# Patient Record
Sex: Male | Born: 1945 | Race: White | Hispanic: No | Marital: Married | Smoking: Current every day smoker
Health system: Southern US, Community
[De-identification: ages and names within clinical notes are randomized; demographics above are authoritative.]

## PROBLEM LIST (undated history)

## (undated) DIAGNOSIS — J449 Chronic obstructive pulmonary disease, unspecified: Secondary | ICD-10-CM

## (undated) DIAGNOSIS — I714 Abdominal aortic aneurysm, without rupture, unspecified: Secondary | ICD-10-CM

## (undated) DIAGNOSIS — R972 Elevated prostate specific antigen [PSA]: Secondary | ICD-10-CM

## (undated) DIAGNOSIS — I10 Essential (primary) hypertension: Secondary | ICD-10-CM

## (undated) DIAGNOSIS — E785 Hyperlipidemia, unspecified: Secondary | ICD-10-CM

## (undated) DIAGNOSIS — N400 Enlarged prostate without lower urinary tract symptoms: Secondary | ICD-10-CM

## (undated) DIAGNOSIS — N200 Calculus of kidney: Secondary | ICD-10-CM

## (undated) HISTORY — DX: Essential (primary) hypertension: I10

## (undated) HISTORY — DX: Calculus of kidney: N20.0

## (undated) HISTORY — PX: OTHER SURGICAL HISTORY: SHX169

## (undated) HISTORY — DX: Abdominal aortic aneurysm, without rupture: I71.4

## (undated) HISTORY — DX: Abdominal aortic aneurysm, without rupture, unspecified: I71.40

## (undated) HISTORY — DX: Elevated prostate specific antigen (PSA): R97.20

## (undated) HISTORY — DX: Benign prostatic hyperplasia without lower urinary tract symptoms: N40.0

## (undated) HISTORY — DX: Chronic obstructive pulmonary disease, unspecified: J44.9

## (undated) HISTORY — DX: Hyperlipidemia, unspecified: E78.5

---

## 2005-01-10 ENCOUNTER — Emergency Department: Payer: Self-pay | Admitting: Internal Medicine

## 2005-01-13 ENCOUNTER — Emergency Department: Payer: Self-pay | Admitting: Emergency Medicine

## 2005-08-12 ENCOUNTER — Inpatient Hospital Stay: Payer: Self-pay | Admitting: Internal Medicine

## 2005-08-12 ENCOUNTER — Other Ambulatory Visit: Payer: Self-pay

## 2006-03-04 ENCOUNTER — Emergency Department: Payer: Self-pay | Admitting: Emergency Medicine

## 2006-06-06 ENCOUNTER — Emergency Department: Payer: Self-pay

## 2006-06-21 ENCOUNTER — Emergency Department: Payer: Self-pay | Admitting: General Practice

## 2006-09-12 ENCOUNTER — Ambulatory Visit: Payer: Self-pay | Admitting: Internal Medicine

## 2008-10-25 ENCOUNTER — Emergency Department: Payer: Self-pay | Admitting: Emergency Medicine

## 2008-11-08 ENCOUNTER — Ambulatory Visit: Payer: Self-pay | Admitting: Urology

## 2008-11-20 ENCOUNTER — Emergency Department: Payer: Self-pay | Admitting: Emergency Medicine

## 2012-04-22 ENCOUNTER — Observation Stay: Payer: Self-pay | Admitting: Student

## 2012-04-22 LAB — BASIC METABOLIC PANEL
Anion Gap: 7 (ref 7–16)
Co2: 23 mmol/L (ref 21–32)
Creatinine: 0.95 mg/dL (ref 0.60–1.30)
EGFR (African American): >60
Osmolality: 281 (ref 275–301)
Sodium: 140 mmol/L (ref 136–145)

## 2012-04-22 LAB — CBC
HCT: 39.9 % — ABNORMAL LOW (ref 40.0–52.0)
MCH: 30.2 pg (ref 26.0–34.0)
MCHC: 34.9 g/dL (ref 32.0–36.0)
MCV: 86 fL (ref 80–100)
RBC: 4.62 10*6/uL (ref 4.40–5.90)
RDW: 15.1 % — ABNORMAL HIGH (ref 11.5–14.5)

## 2012-04-22 LAB — CK TOTAL AND CKMB (NOT AT ARMC)
CK, Total: 58 U/L (ref 35–232)
CK-MB: 1.2 ng/mL (ref 0.5–3.6)

## 2012-04-22 LAB — TROPONIN I: Troponin-I: <0.02 ng/mL

## 2012-04-23 LAB — CK TOTAL AND CKMB (NOT AT ARMC)
CK, Total: 42 U/L (ref 35–232)
CK, Total: 35 U/L (ref 35–232)
CK-MB: 0.8 ng/mL (ref 0.5–3.6)

## 2012-04-23 LAB — BASIC METABOLIC PANEL
BUN: 16 mg/dL (ref 7–18)
Chloride: 108 mmol/L — ABNORMAL HIGH (ref 98–107)
EGFR (African American): >60
EGFR (Non-African Amer.): >60
Osmolality: 280 (ref 275–301)

## 2012-04-23 LAB — CBC WITH DIFFERENTIAL/PLATELET
Basophil %: 0.4 %
Eosinophil #: 0.2 10*3/uL (ref 0.0–0.7)
HCT: 36 % — ABNORMAL LOW (ref 40.0–52.0)
HGB: 12.7 g/dL — ABNORMAL LOW (ref 13.0–18.0)
MCH: 30.2 pg (ref 26.0–34.0)
MCHC: 35.2 g/dL (ref 32.0–36.0)
Monocyte %: 10.7 %
Neutrophil #: 5.4 10*3/uL (ref 1.4–6.5)
Platelet: 211 10*3/uL (ref 150–440)
RDW: 15.2 % — ABNORMAL HIGH (ref 11.5–14.5)
WBC: 8.5 10*3/uL (ref 3.8–10.6)

## 2012-04-23 LAB — LIPID PANEL
HDL Cholesterol: 25 mg/dL — ABNORMAL LOW (ref 40–60)
Triglycerides: 175 mg/dL (ref 0–200)

## 2012-04-23 LAB — TROPONIN I: Troponin-I: <0.02 ng/mL

## 2012-07-21 ENCOUNTER — Other Ambulatory Visit: Payer: Medicare PPO

## 2012-08-02 ENCOUNTER — Ambulatory Visit: Payer: Self-pay

## 2012-08-04 ENCOUNTER — Other Ambulatory Visit: Payer: Medicare PPO

## 2012-08-18 ENCOUNTER — Ambulatory Visit: Payer: Medicare PPO | Admitting: Cardiology

## 2012-08-29 ENCOUNTER — Other Ambulatory Visit: Payer: Medicare PPO

## 2012-08-29 DIAGNOSIS — R0989 Other specified symptoms and signs involving the circulatory and respiratory systems: Secondary | ICD-10-CM

## 2012-09-05 ENCOUNTER — Other Ambulatory Visit: Payer: Self-pay

## 2012-09-05 ENCOUNTER — Other Ambulatory Visit (INDEPENDENT_AMBULATORY_CARE_PROVIDER_SITE_OTHER): Payer: Medicare PPO

## 2012-09-05 DIAGNOSIS — I517 Cardiomegaly: Secondary | ICD-10-CM

## 2013-01-02 ENCOUNTER — Emergency Department: Payer: Self-pay | Admitting: Emergency Medicine

## 2013-01-30 ENCOUNTER — Ambulatory Visit: Payer: Self-pay | Admitting: Urology

## 2013-02-28 ENCOUNTER — Ambulatory Visit: Payer: Self-pay | Admitting: Vascular Surgery

## 2013-05-25 ENCOUNTER — Emergency Department: Payer: Self-pay | Admitting: Emergency Medicine

## 2013-05-25 LAB — CBC
HCT: 39.9 % — AB (ref 40.0–52.0)
HGB: 13.6 g/dL (ref 13.0–18.0)
MCH: 29.7 pg (ref 26.0–34.0)
MCHC: 34.1 g/dL (ref 32.0–36.0)
MCV: 87 fL (ref 80–100)
Platelet: 224 10*3/uL (ref 150–440)
RBC: 4.58 10*6/uL (ref 4.40–5.90)
RDW: 14.8 % — AB (ref 11.5–14.5)
WBC: 8.4 10*3/uL (ref 3.8–10.6)

## 2013-05-25 LAB — BASIC METABOLIC PANEL
Anion Gap: 6 — ABNORMAL LOW (ref 7–16)
BUN: 17 mg/dL (ref 7–18)
CALCIUM: 9.1 mg/dL (ref 8.5–10.1)
Chloride: 110 mmol/L — ABNORMAL HIGH (ref 98–107)
Co2: 26 mmol/L (ref 21–32)
Creatinine: 1.16 mg/dL (ref 0.60–1.30)
EGFR (African American): >60
Glucose: 99 mg/dL (ref 65–99)
OSMOLALITY: 285 (ref 275–301)
Potassium: 3.8 mmol/L (ref 3.5–5.1)
Sodium: 142 mmol/L (ref 136–145)

## 2013-05-25 LAB — TROPONIN I: Troponin-I: <0.02 ng/mL

## 2013-05-26 LAB — TROPONIN I: Troponin-I: <0.02 ng/mL

## 2014-07-12 NOTE — Discharge Summary (Signed)
PATIENT NAME:  Glenn Hill, Glenn Hill MR#:  [id number] DATE OF BIRTH:  1946-01-23  DATE OF ADMISSION:  04/22/2012  DATE OF DISCHARGE:  04/24/2012  CONSULTANT:  Dr. Lady GaryFath from cardiology.   CHIEF COMPLAINT:   Dizziness and chest pain.   DISCHARGE DIAGNOSES: 1.  Chest pain, likely not cardiac.  2.  Dizziness, likely from malignant accelerated hypertension.  3.  History of aortic aneurysm.  4.  Cardiomyopathy, an ejection fraction of about 40%.  5.  History of noncompliance with medications.  6.  Depression.  7.  History of aortic dissection.  8.  History of nephrolithiasis.   DISCHARGE MEDICATIONS:  Simvastatin 20 mg daily, metoprolol 50 mg every 12 hours, losartan 25 mg daily.   DIET: Low sodium, low fat, low cholesterol.   ACTIVITY: As tolerated.   FOLLOWUP: Please follow with your primary care physician in 4 to 6 weeks, as previously discussed. Please follow with a physician and check your blood pressure and electrolytes within 1 to 2 weeks. Please follow with your cardiologist and a vascular surgeon within 1 to 2 weeks for further evaluation. Please follow with vascular surgery to schedule further imaging studies for your aneurysm.  Please follow with cardiology for further evaluation of your heart and cardiomyopathy.   DISPOSITION:  Discharge home.   HISTORY OF PRESENT ILLNESS AND HOSPITAL COURSE:  For full details of H and P, please see the dictation on 04/22/2012, by Dr. Allena KatzPatel. In brief, this is a 69 year old gentleman with history of poorly controlled hypertension, not taking any medications for several years; history of aneurysm and aorta status post dissection in the past, who came in with dizziness and brief episode of chest pain that he described as a few seconds. He had no abdominal pain, nausea, vomiting or diarrhea. On arrival, he was noted to have significantly elevated blood pressure of 211/104, and admitted to the hospitalist service for further evaluation and management.  There was some EKG changes on admission per HPI.  Cardiology was consulted. The patient was admitted to telemetry. Cyclic cardiac markers were obtained. They are all negative x 3 in terms of CK-MB, and the troponins were negative x 4. He had had no further episodes of chest pain. He was ruled out for acute coronary syndrome. He underwent a stress test; however, per the nurse, the stress test was indeterminant, and per cardiology, as he had drank some coffee, he should follow up as an outpatient for further evaluation. An echocardiogram, which was obtained while he was here, showed an EF of about 40%. He does not appear to be in acute CHF, and the etiology of the cardiomyopathy is unclear, at this point. He was started on a beta blocker and HCTZ on arrival, but he was discharged on metoprolol and losartan, in addition to simvastatin. He did have a CT of the head, which was negative for acute events. He did have a CT of the chest to evaluate for dissection. 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  His symptoms have resolved. He will be discharged with outpatient followup, and he was strongly encouraged to follow up for his blood pressure check with a physician and follow with vascular surgery for  his aneurysm, and cardiology for his cardiomyopathy.   TOTAL TIME SPENT: 35 minutes.   CODE STATUS: PATIENT IS FULL CODE.   ____________________________ Krystal Eaton, MD sa:dm D: 04/25/2012 07:57:40 ET T: 04/25/2012 11:57:03 ET JOB#: [id number]  cc: Krystal Eaton, MD, <Dictator> Krystal Eaton MD ELECTRONICALLY SIGNED 04/27/2012 04:54

## 2014-07-12 NOTE — H&P (Signed)
PATIENT NAME:  Glenn Hill, Glenn Hill MR#:  [id number] DATE OF BIRTH:  1945/05/19  DATE OF ADMISSION:  04/22/2012  PRIMARY CARE PROVIDER: None   CHIEF COMPLAINT: Chest pain, dizziness, elevated blood pressure.   HISTORY OF PRESENT ILLNESS: The patient is a 69 year old white male with a history of hypertension which is very poorly controlled, not taking any medications, who also has a history of having a thoracic aneurysm. He just had a dissection that [facility], who presents having a brief episode of chest pain that felt like somebody was punching his chest. The patient came to the ED and was noted to have blood pressure of  211/104.  The patient initially when he came had an EKG checked which did not show any lateral T wave inversions. Subsequently, EKG was checked a few hours later which showed T wave inversions in leads V5, V6 and the lateral leads. The patient also reports that he was feeling dizzy earlier today as well. The patient is being very vague about his chest pain and did not want to stay in the hospital. He denies any abdominal pain, nausea, vomiting, diarrhea. He denies any chest pain on exertion prior to this.   PAST MEDICAL HISTORY: 1. Depression.  2. Hypertension.  3. History of thoracic aneurysm dissection.  4. History of kidney stones.   ALLERGIES: SULFA.   MEDICATIONS: None.   SOCIAL HISTORY: He smokes about 1 to 2 packs per day. Denies any alcohol or drug use.   FAMILY HISTORY: Coronary artery disease in the 7650s.    REVIEW OF SYSTEMS:  CONSTITUTIONAL: Denies any fevers. Complains of fatigue, weakness. No weight loss, no weight gain.  HEENT: Eyes: No blurred or double vision. No pain. No redness. No inflammation. No glaucoma. No cataracts. ENT: No tinnitus. No ear pain. No hearing loss. No allergies, seasonal or year round. No epistaxis. No discharge. No snoring.  RESPIRATORY: No cough. No wheezing. No hemoptysis. No COPD, no  tuberculosis, no pneumonia.  CARDIOVASCULAR: Denies any chest pain, orthopnea. No edema. No arrhythmia.  GASTROINTESTINAL: No nausea, vomiting, diarrhea. No abdominal pain. No hematemesis. No melena.  GENITOURINARY: Denies any dysuria, hematuria, renal calculus or frequency.  ENDOCRINE:  Denies any polyuria, nocturia, or thyroid problems.  HEMATOLOGIC/LYMPHATIC: Denies any anemia, easy bruisability, or bleeding.  SKIN: No acne. No rash. No changes in mole, hair or skin.  MUSCULOSKELETAL: No pain in the neck, back, or shoulder.  NEUROLOGICAL: No numbness. No CVA. No TIA, no seizures.  PSYCHIATRIC: No insomnia. No ADD. No OCD.   PHYSICAL EXAMINATION: VITAL SIGNS: Temperature 98.5, pulse 78, respirations 21, blood pressure initially was 211/104, O2 97%.  GENERAL: The patient is a well-developed, well-nourished male in no acute distress.  HEENT: Head atraumatic, normocephalic. Pupils are equally round, reactive to light and accommodation. There is no conjunctival pallor. No scleral icterus. Nasal exam shows no drainage or ulceration. Oropharynx is clear without any exudate.  NECK: No thyromegaly. No carotid bruits.  CARDIOVASCULAR: Regular rate and rhythm. No murmur, rubs, clicks, or gallops. PMI is not displaced.  LUNGS: Clear to auscultation bilaterally without any rales, rhonchi, or wheezing.  ABDOMEN: Soft, nontender, nondistended. Positive bowel sounds x 4.  EXTREMITIES: No clubbing, cyanosis, or edema.  SKIN: No rash.  LYMPHATICS: No lymph nodes palpable.  NEUROLOGICAL: Awake, alert, oriented x 3. No focal deficits.  PSYCHIATRIC: Not anxious or depressed.   LABORATORY, DIAGNOSTIC AND RADIOLOGICAL DATA:  Glucose 96, BUN 18, creatinine 0.95, sodium  140, potassium 3.7, chloride 110, CO2 23, calcium 8.9. CPK 58, CK-MB 1.2. Troponin less than 0.02. WBC 9.3, hemoglobin 13.9, platelet count 236.   EKG shows lateral T wave inversions which are new compared to earlier EKG.   ASSESSMENT AND  PLAN: The patient is a 69 year old with a history of uncontrolled hypertension, medication noncompliance, history of thoracic aortic aneurysm dissection, presents with chest pain, also noted to have accelerated hypertension.   1. Chest pain, possibly related to accelerated hypertension: CT per dissection protocol is currently pending. At this time, we will follow cardiac enzymes, place him on aspirin and beta blockers, cardiology evaluation in the a.m. May need further stress test, etc. work-up for coronary artery disease.  2. Accelerated hypertension: We will start him on metoprolol/HCTZ p.Hill.n., hydralazine.  3. Nicotine addiction: The patient was recommended to stop nicotine offered.   TIME SPENT:  35 minutes.   ____________________________ Lacie Scotts Allena Katz, MD shp:cb D: 04/22/2012 21:18:43 ET T: 04/23/2012 14:34:32 ET JOB#: [id number]  cc: Sumayyah Custodio H. Allena Katz, MD, <Dictator> Charise Carwin MD ELECTRONICALLY SIGNED 04/23/2012 16:45

## 2014-07-12 NOTE — Consult Note (Signed)
    General Aspect 69 yo male with history of descending aortic aneurysm treated medically who presented with complaints of chest pain. He was evaluated with chest and abdominal ct scan which revealed no evidence of disection but he did have a 4 cm ascending aortic aneurysm. He has ruled out for an mi. ECG did not reveal any significant    Sulfa drugs: Swelling  Electronic Signatures: Dalia HeadingFath, Preston Garabedian A (MD)  (Signed 03-Feb-14 09:05)  Authored: General Aspect/Present Illness, Allergies   Last Updated: 03-Feb-14 09:05 by Dalia HeadingFath, Moon Budde A (MD)

## 2014-07-12 NOTE — Consult Note (Signed)
Brief Consult Note: Diagnosis: Pt with history of aaa repair now admitted with hypertension and chest pain.   Patient was seen by consultant.   Recommend further assessment or treatment.   Comments: 69 yo male with history of aaa repair in the past now with hypertension and progresssive chest pain. Admitts to non compliance with meds. Has ruled out for mi. COntinues to smoke. Will rule out and consider funcitonal sstudy to assess for possible ischemia. FUll note to followl.  Electronic Signatures: Dalia HeadingFath, Doraine Schexnider A (MD)  (Signed 03-Feb-14 05:46)  Authored: Brief Consult Note   Last Updated: 03-Feb-14 05:46 by Dalia HeadingFath, Parveen Freehling A (MD)

## 2014-07-12 NOTE — H&P (Signed)
PATIENT NAME:  Melchor AmourSNELL, Kasin R MR#:  [id number] DATE OF BIRTH:  [birth year]  DATE OF ADMISSION:  04/22/2012  NO DICTATION  ____________________________ Lacie ScottsShreyang H. Allena KatzPatel, MD shp:ct D: 04/22/2012 21:18:59 ET T: 04/23/2012 14:06:26 ET JOB#: [id number]  cc: Bassem Bernasconi H. Allena KatzPatel, MD, <Dictator> Charise CarwinSHREYANG H Aide Wojnar MD ELECTRONICALLY SIGNED 04/29/2012 8:28

## 2014-10-15 IMAGING — CT CT ABDOMEN AND PELVIS WITHOUT AND WITH CONTRAST
2 of 4 series · 12 of 32 positions shown, 17 images · non-contrast
Comparison: none

REASON FOR EXAM: hematuria
COMMENTS:

[Series 4: with 3.0 i40f 3 · axial · 0.81mm/px · z∈[-1102,-727]mm · 8 of 161 slices shown, 13 images]
[im 18/161  soft-tissue]
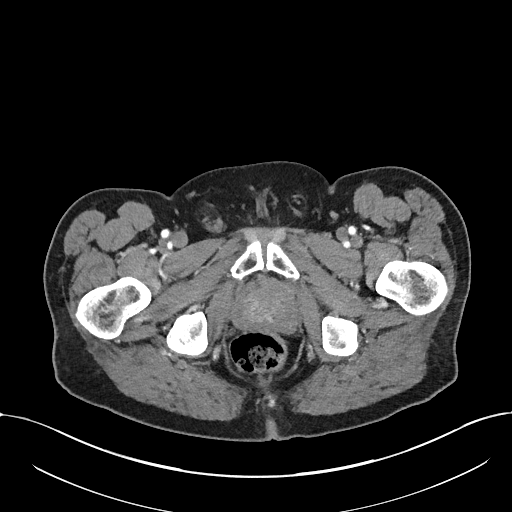
[im 18/161  bone]
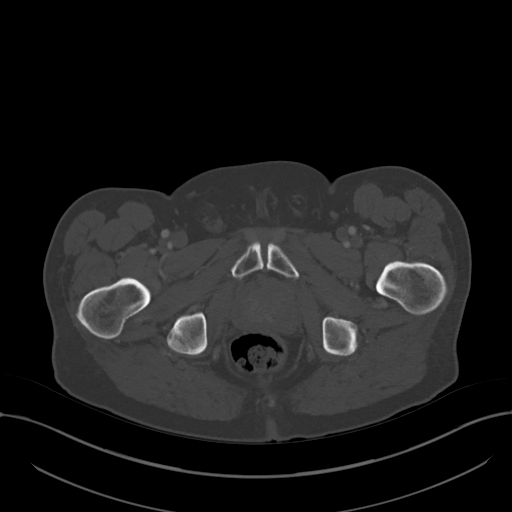
[im 36/161  soft-tissue]
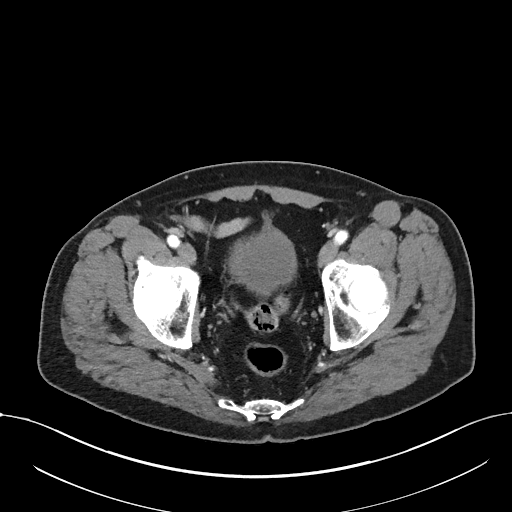
[im 54/161  soft-tissue]
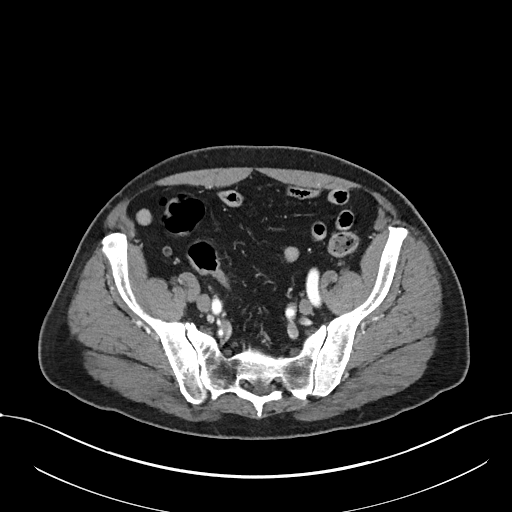
[im 72/161  soft-tissue]
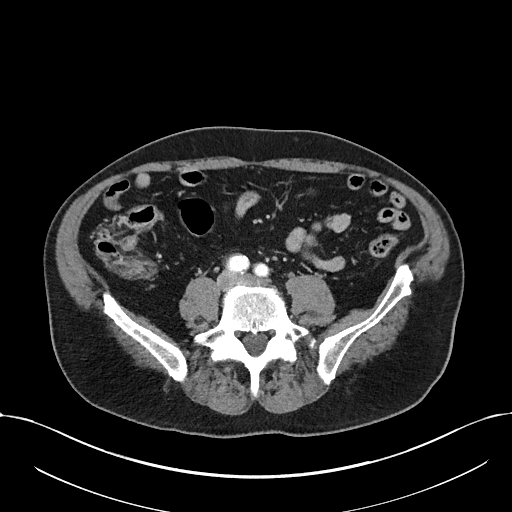
[im 89/161  soft-tissue]
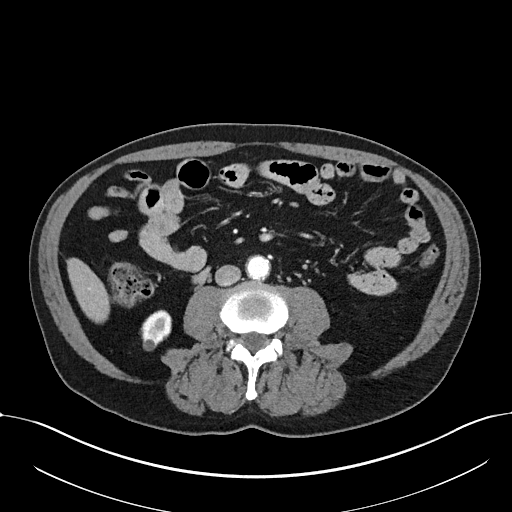
[im 89/161  lung]
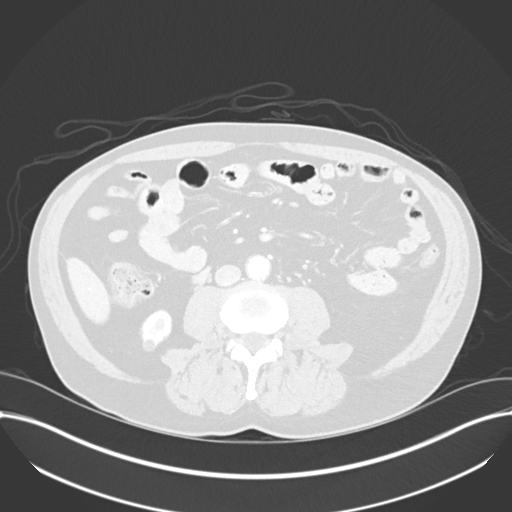
[im 107/161  soft-tissue]
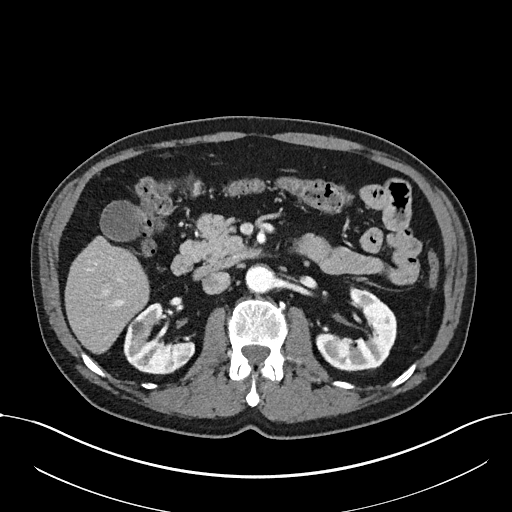
[im 107/161  lung]
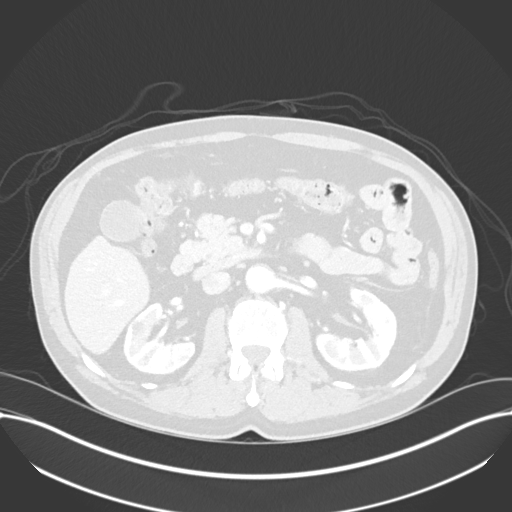
[im 125/161  soft-tissue]
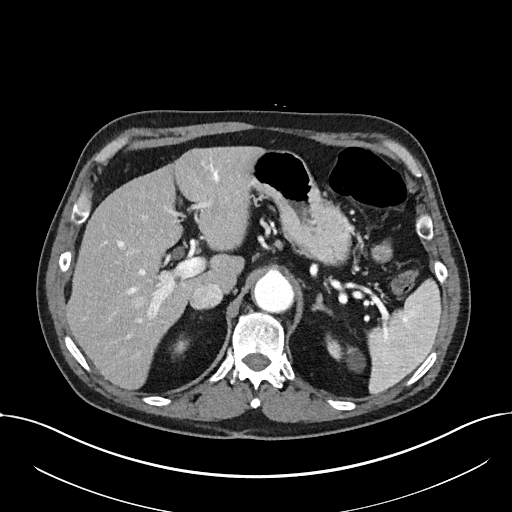
[im 125/161  lung]
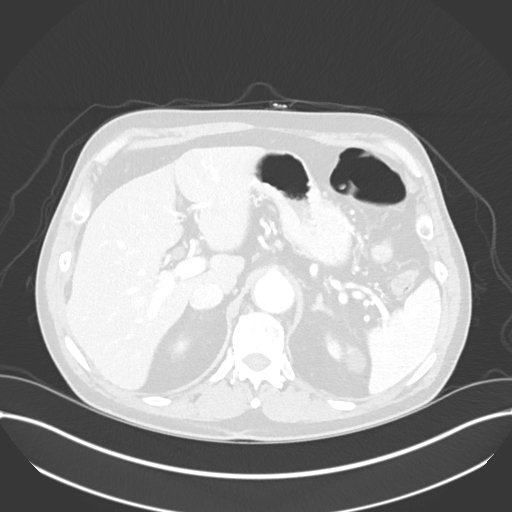
[im 143/161  soft-tissue]
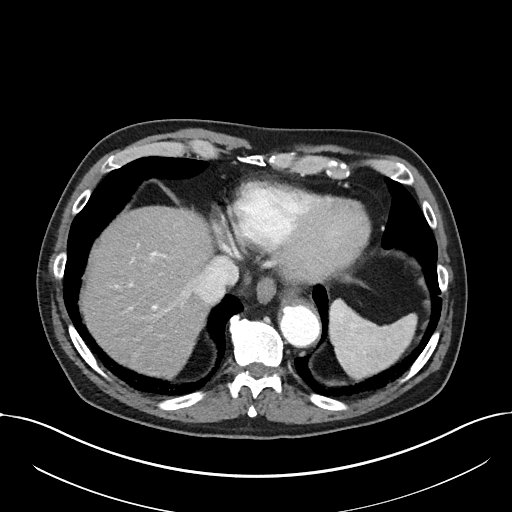
[im 143/161  lung]
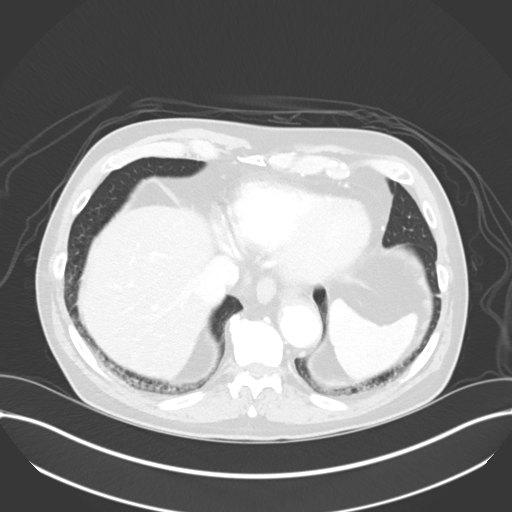

[Series 6: delay 3.0 i40f 3 · axial · delayed · 0.81mm/px · z∈[-1102,-940]mm · 4 of 161 slices shown]
[im 18/161  soft-tissue]
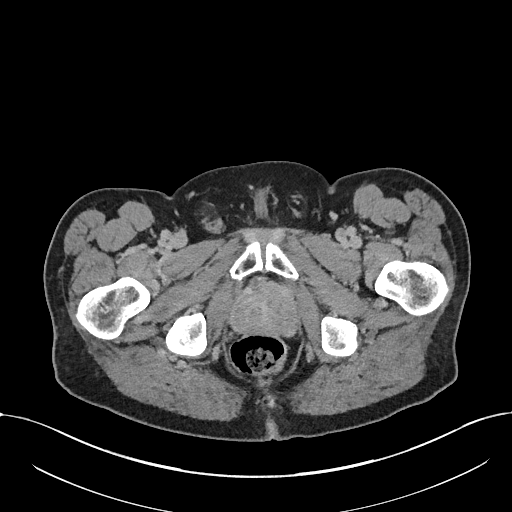
[im 36/161  soft-tissue]
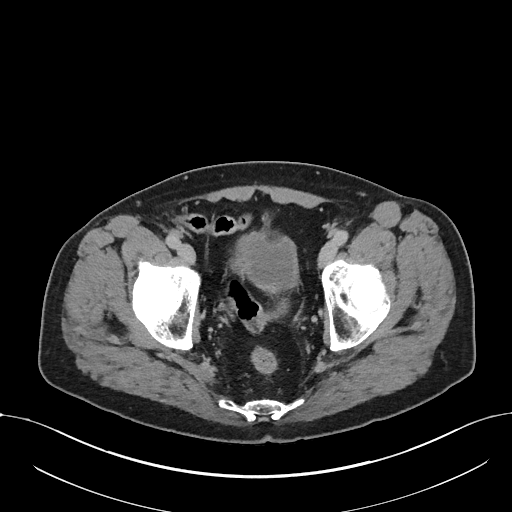
[im 54/161  soft-tissue]
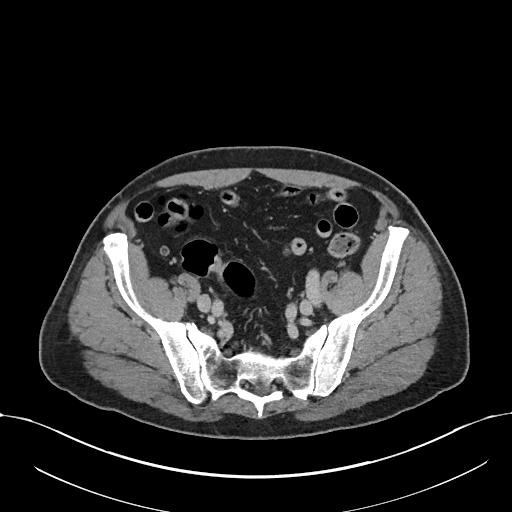
[im 72/161  soft-tissue]
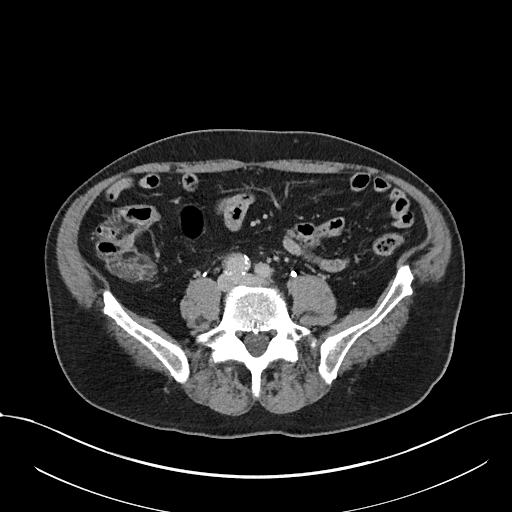

[12 of 32 positions shown; findings below may reference images not displayed]

PROCEDURE:     KCT - KCT ABDOMEN/PELVIS W/WO  - August 02, 2012 [DATE]

RESULT:     A triphasic CT of the abdomen and pelvis is performed. The
patient has a previous noncontrast CT of the abdomen and pelvis from 12 September, 2006. Images are reconstructed at 3 mm slice thickness in the axial plane.
The patient received a dose of 100 mL of Esovue-Y6S iodinated intravenous
contrast for the examination.

Noncontrast images show fairly large stone density in the mid right kidney
showing an oblique anterior-posterior dimension approximately 1.5 cm with a
superior to inferior length of 1.3 cm. There is an additional small stone in
the upper pole collecting system the right kidney with a large stone seen in
the lower pole collecting system of the left kidney showing an oblique
anterior-posterior dimension of 1.6 cm. There is a 2 mm midpole left kidney
stone. There appear to be multiple low-attenuation lesions in both kidneys.
There is a fat filled right inguinal hernia. There is aneurysmal dilation of
the right common iliac artery 2 approximately 2.6 cm diameter anterior to
posterior. This appears to have increased compared to the previous study. A
normal appearing appendix is present. No radiopaque gallstones are evident.
There is aneurysmal dilation in the distal thoracic aorta which is known
from previous angiographic studies.

Following contrast administration there noted to be multiple small
nonenhancing areas in both kidneys consistent with cysts. One of the largest
is in the upper pole of the left kidney measuring is much as 2.2 cm
diameter. There is a peripherally enhancing lesion within the right lobe of
the liver measuring up to 4.4 cm which is fully opacified on postcontrast
delayed images and may represent a large hemangioma. There appears to be a
small hepatic cyst adjacent to the gallbladder fossa best appreciated on the
postcontrast images and delayed postcontrast images in the area of images 46
to 48. The liver otherwise appears to be unremarkable. The stomach, spleen,
distal esophagus, adrenal glands, gallbladder and pancreas are otherwise
unremarkable. There is no abnormal bowel distention or bowel wall
thickening. The prostate is mildly enlarged with some calcifications
present. The base of the bladder is somewhat irregular, possibly secondary
to the prostate. Correlate with clinical and laboratory information. The
postcontrast images show excretion of contrast passed 5 urine by both
kidneys without obstructive change. Other than the deformity of the base the
bladder by the prostate there is no significant abnormality. No adenopathy
is evident. There is no ascites or abnormal fluid collection. No
inflammatory changes appear present.
IMPRESSION: 1. Bilateral renal calculi most prominently seen in the upper pole the right
kidney and lower pole the left kidney with additional small stones. No
obstructive change. There multiple small bilateral renal cysts and a small
at a cyst adjacent to the gallbladder fossa and measuring up to 2.0 cm.
2. Probable right lobe hepatic hemangioma which appears unchanged compared
to previous arteriographic CT.
3. Distal thoracic aortic aneurysm. Right common iliac artery aneurysm. The
iliac artery aneurysms slightly larger than on the previous study.
4. Normal-appearing appendix.
5. Some scattered atherosclerotic calcification.
6. The lung bases appear to be clear. There is minimal dependent atelectasis
noted.

[REDACTED]

## 2014-12-19 ENCOUNTER — Encounter: Payer: Self-pay | Admitting: *Deleted

## 2014-12-19 ENCOUNTER — Ambulatory Visit: Payer: Self-pay | Admitting: Urology

## 2014-12-24 ENCOUNTER — Encounter: Payer: Self-pay | Admitting: Urology

## 2014-12-24 ENCOUNTER — Ambulatory Visit: Payer: Self-pay | Admitting: Urology

## 2015-02-26 ENCOUNTER — Encounter: Payer: Self-pay | Admitting: Medical Oncology

## 2015-02-26 ENCOUNTER — Emergency Department: Payer: Medicare PPO

## 2015-02-26 ENCOUNTER — Emergency Department
Admission: EM | Admit: 2015-02-26 | Discharge: 2015-02-26 | Disposition: A | Discharge status: Home / Self Care | Payer: Medicare PPO | Attending: Emergency Medicine | Admitting: Emergency Medicine

## 2015-02-26 DIAGNOSIS — Z7982 Long term (current) use of aspirin: Secondary | ICD-10-CM | POA: Diagnosis not present

## 2015-02-26 DIAGNOSIS — R42 Dizziness and giddiness: Secondary | ICD-10-CM | POA: Diagnosis not present

## 2015-02-26 DIAGNOSIS — I10 Essential (primary) hypertension: Secondary | ICD-10-CM | POA: Diagnosis not present

## 2015-02-26 DIAGNOSIS — F17200 Nicotine dependence, unspecified, uncomplicated: Secondary | ICD-10-CM | POA: Diagnosis not present

## 2015-02-26 DIAGNOSIS — R079 Chest pain, unspecified: Secondary | ICD-10-CM

## 2015-02-26 DIAGNOSIS — Z79899 Other long term (current) drug therapy: Secondary | ICD-10-CM | POA: Insufficient documentation

## 2015-02-26 LAB — BASIC METABOLIC PANEL
Anion gap: 8 (ref 5–15)
BUN: 18 mg/dL (ref 6–20)
CALCIUM: 9.1 mg/dL (ref 8.9–10.3)
CO2: 22 mmol/L (ref 22–32)
CREATININE: 0.96 mg/dL (ref 0.61–1.24)
Chloride: 109 mmol/L (ref 101–111)
GFR calc Af Amer: >60 mL/min (ref >60)
GFR calc non Af Amer: >60 mL/min (ref >60)
GLUCOSE: 101 mg/dL — AB (ref 65–99)
Potassium: 3.9 mmol/L (ref 3.5–5.1)
Sodium: 139 mmol/L (ref 135–145)

## 2015-02-26 LAB — CBC
HCT: 38.1 % — ABNORMAL LOW (ref 40.0–52.0)
HEMOGLOBIN: 13.4 g/dL (ref 13.0–18.0)
MCH: 30.6 pg (ref 26.0–34.0)
MCHC: 35.1 g/dL (ref 32.0–36.0)
MCV: 87.1 fL (ref 80.0–100.0)
PLATELETS: 239 10*3/uL (ref 150–440)
RBC: 4.38 MIL/uL — ABNORMAL LOW (ref 4.40–5.90)
RDW: 14.9 % — ABNORMAL HIGH (ref 11.5–14.5)
WBC: 7.6 10*3/uL (ref 3.8–10.6)

## 2015-02-26 LAB — TROPONIN I: Troponin I: <0.03 ng/mL (ref <0.031)

## 2015-02-26 NOTE — ED Provider Notes (Signed)
[facility] Emergency Department Provider Note  ____________________________________________  Time seen: Approximately 8 AM  I have reviewed the triage vital signs and the nursing notes.   HISTORY  Chief Complaint Chest Pain and Dizziness    HPI Glenn Hill is a 69 y.o. male with a history of a AAA that is present today with chest pressure. He says that for 3-4 weeks he has had shooting chest pain only lasts several seconds. He says that he can be to his right or left side of his chest and is nonradiating. He says that it is also not provoked. However, he was sitting this morning when he started to feel midsternal chest pressure. He says that he felt for about 5 minutes. It was mild to moderate. There was no associated shortness of breath, nausea vomiting or diaphoresis. He does not of any history of heart disease with his father died from heart attack. He also smokes cigarettes (down to less than a pack a day at this time. Denies any chest pain this time. Did have a full dose of aspirin, 325 mg, this morning.   Past Medical History  Diagnosis Date  . HLD (hyperlipidemia)   . Chest wall pain   . HTN (hypertension)   . Kidney stone   . BPH (benign prostatic hyperplasia)   . Microscopic hematuria   . AAA (abdominal aortic aneurysm) (HCC)   . Bladder wall thickening   . Elevated PSA     There are no active problems to display for this patient.   Past Surgical History  Procedure Laterality Date  . Leg circulation surgery Left     Current Outpatient Rx  Name  Route  Sig  Dispense  Refill  . aspirin EC 81 MG tablet   Oral   Take 81 mg by mouth daily.         . metoprolol succinate (TOPROL-XL) 50 MG 24 hr tablet   Oral   Take 50 mg by mouth daily. Take with or immediately following a meal.         . simvastatin (ZOCOR) 20 MG tablet   Oral   Take 20 mg by mouth daily.         Marland Kitchen losartan (COZAAR) 50 MG tablet   Oral   Take 50 mg by  mouth daily.         . silodosin (RAPAFLO) 8 MG CAPS capsule   Oral   Take 8 mg by mouth daily with breakfast.         . tamsulosin (FLOMAX) 0.4 MG CAPS capsule   Oral   Take 0.4 mg by mouth daily.           Allergies Sulfa antibiotics  Family History  Problem Relation Age of Onset  . Dementia Father   . Alzheimer's disease Mother   . Heart attack Father     Social History Social History  Substance Use Topics  . Smoking status: Current Every Day Smoker  . Smokeless tobacco: None  . Alcohol Use: No    Review of Systems Constitutional: No fever/chills Eyes: No visual changes. ENT: No sore throat. Cardiovascular: As above  Respiratory: Denies shortness of breath. Gastrointestinal: No abdominal pain.  No nausea, no vomiting.  No diarrhea.  No constipation. Genitourinary: Negative for dysuria. Musculoskeletal: Negative for back pain. Skin: Negative for rash. Neurological: Negative for headaches, focal weakness or numbness.  10-point ROS otherwise negative.  ____________________________________________   PHYSICAL EXAM:  VITAL SIGNS: ED  Triage Vitals  Enc Vitals Group     BP 02/26/15 0748 147/85 mmHg     Pulse Rate 02/26/15 0748 76     Resp 02/26/15 0748 20     Temp 02/26/15 0748 97.5 F (36.4 C)     Temp Source 02/26/15 0748 Oral     SpO2 02/26/15 0748 97 %     Weight 02/26/15 0748 180 lb (81.647 kg)     Height 02/26/15 0748  (1.702 m)     Head Cir --      Peak Flow --      Pain Score 02/26/15 0817 3     Pain Loc --      Pain Edu? --      Excl. in GC? --     Constitutional: Alert and oriented. Well appearing and in no acute distress. Eyes: Conjunctivae are normal. PERRL. EOMI. Head: Atraumatic. Nose: No congestion/rhinnorhea. Mouth/Throat: Mucous membranes are moist.  Oropharynx non-erythematous. Neck: No stridor.   Cardiovascular: Normal rate, regular rhythm. Grossly normal heart sounds.  Good peripheral circulation. Respiratory:  Normal respiratory effort.  No retractions. Lungs CTAB. Gastrointestinal: Soft and nontender. No distention. No abdominal bruits. No CVA tenderness. Musculoskeletal: No lower extremity tenderness nor edema.  No joint effusions. Neurologic:  Normal speech and language. No gross focal neurologic deficits are appreciated. No gait instability. Skin:  Skin is warm, dry and intact. No rash noted. Psychiatric: Mood and affect are normal. Speech and behavior are normal.  ____________________________________________   LABS (all labs ordered are listed, but only abnormal results are displayed)  Labs Reviewed  BASIC METABOLIC PANEL - Abnormal; Notable for the following:    Glucose, Bld 101 (*)    All other components within normal limits  CBC - Abnormal; Notable for the following:    RBC 4.38 (*)    HCT 38.1 (*)    RDW 14.9 (*)    All other components within normal limits  TROPONIN I  TROPONIN I   ____________________________________________  EKG  ED ECG REPORT I, Arelia Longest, the attending physician, personally viewed and interpreted this ECG.   Date: 02/26/2015  EKG Time: 745  Rate: 75  Rhythm: normal sinus rhythm  Axis: Normal axis  Intervals:none  ST&T Change: No ST segment elevation or depression. Poor baseline with static and will repeat for more specific ST and T wave information.  ED ECG REPORT I, Arelia Longest, the attending physician, personally viewed and interpreted this ECG.   Date: 02/26/2015  EKG Time: 821  Rate: 73  Rhythm: normal sinus rhythm  Axis: Normal axis  Intervals:none  ST&T Change: T-wave inversion in lead V3 as well as biphasic morphology and V4. These morphologies are unchanged from multiple previous EKGs.  ____________________________________________  RADIOLOGY  Mild right middle lobe subsegmental atelectasis and or infiltrate cannot be excluded. I personally reviewed these  films. ____________________________________________   PROCEDURES  ____________________________________________   INITIAL IMPRESSION / ASSESSMENT AND PLAN / ED COURSE  Pertinent labs & imaging results that were available during my care of the patient were reviewed by me and considered in my medical decision making (see chart for details).  ----------------------------------------- 11:13 AM on 02/26/2015 -----------------------------------------  Patient resting comfortably at this time and remains pain-free. I discussed case Dr. Juliann Pares who agrees to the patient in the office. I gave Dr. Juliann Pares the patient's contact information to schedule follow-up appointment. The patient will also be given offices contact information to schedule follow-up up on him.  The patient is aware that he needs to call this afternoon to schedule his appointment within the next 1-2 days. He knows return precautions for any worsening or concerning symptoms. At this time he is resting comfortably. My biggest concern is that the patient is showing signs of ACS however he had a reassuring workup. Less likely diagnoses are pulmonary embolus as well as dissection which I do not think would result in resolution of the patient's symptoms. ____________________________________________   FINAL CLINICAL IMPRESSION(S) / ED DIAGNOSES  Chest pain    Glenn Blazeravid Matthew Yichen Gilardi, MD 02/26/15 1114

## 2015-02-26 NOTE — ED Notes (Signed)
Pt reports that he has been having chest pain off and on for about 3-4 weeks, states this am he was at work when he became dizzy.

## 2015-02-26 NOTE — ED Notes (Signed)
NAD noted at time of D/C. Pt denies questions or concerns. Pt ambulatory to the lobby at this time.  

## 2015-06-03 ENCOUNTER — Emergency Department
Admission: EM | Admit: 2015-06-03 | Discharge: 2015-06-03 | Discharge status: Against Medical Advice | Payer: Medicare PPO | Attending: Emergency Medicine | Admitting: Emergency Medicine

## 2015-06-03 ENCOUNTER — Emergency Department: Payer: Medicare PPO

## 2015-06-03 DIAGNOSIS — F17200 Nicotine dependence, unspecified, uncomplicated: Secondary | ICD-10-CM | POA: Diagnosis not present

## 2015-06-03 DIAGNOSIS — Z7982 Long term (current) use of aspirin: Secondary | ICD-10-CM | POA: Diagnosis not present

## 2015-06-03 DIAGNOSIS — R079 Chest pain, unspecified: Secondary | ICD-10-CM | POA: Diagnosis present

## 2015-06-03 DIAGNOSIS — Z79899 Other long term (current) drug therapy: Secondary | ICD-10-CM | POA: Diagnosis not present

## 2015-06-03 DIAGNOSIS — I10 Essential (primary) hypertension: Secondary | ICD-10-CM | POA: Insufficient documentation

## 2015-06-03 DIAGNOSIS — R0602 Shortness of breath: Secondary | ICD-10-CM | POA: Insufficient documentation

## 2015-06-03 LAB — TROPONIN I

## 2015-06-03 LAB — BASIC METABOLIC PANEL
ANION GAP: 6 (ref 5–15)
BUN: 21 mg/dL — ABNORMAL HIGH (ref 6–20)
CALCIUM: 8.7 mg/dL — AB (ref 8.9–10.3)
CO2: 22 mmol/L (ref 22–32)
Chloride: 111 mmol/L (ref 101–111)
Creatinine, Ser: 1.03 mg/dL (ref 0.61–1.24)
GLUCOSE: 118 mg/dL — AB (ref 65–99)
Potassium: 3.8 mmol/L (ref 3.5–5.1)
Sodium: 139 mmol/L (ref 135–145)

## 2015-06-03 LAB — CBC
HCT: 37.7 % — ABNORMAL LOW (ref 40.0–52.0)
HEMOGLOBIN: 13.1 g/dL (ref 13.0–18.0)
MCH: 30.1 pg (ref 26.0–34.0)
MCHC: 34.7 g/dL (ref 32.0–36.0)
MCV: 86.7 fL (ref 80.0–100.0)
Platelets: 152 10*3/uL (ref 150–440)
RBC: 4.35 MIL/uL — AB (ref 4.40–5.90)
RDW: 15 % — ABNORMAL HIGH (ref 11.5–14.5)
WBC: 8.9 10*3/uL (ref 3.8–10.6)

## 2015-06-03 NOTE — ED Notes (Signed)
Pt uprite on stretcher in exam room with no distress noted, watching TV; st awoke PTA with mid CP, nonradiating with no accomp symptoms; resp even/unlab, lungs clear, apical audible and regular; card monitor in place; SO at bedside; Dr Manson PasseyBrown in to assess pt

## 2015-06-03 NOTE — ED Notes (Signed)
Pt noted dressed & leaving room with steady gait, accomp by SO; MD notified

## 2015-06-03 NOTE — ED Notes (Signed)
Pt in with co midsternal chest pain that woke him up 1 hr ago states does have some shob.
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
## 2015-06-04 NOTE — ED Provider Notes (Signed)
[facility] Emergency Department Provider Note  ____________________________________________  Time seen: 2:20 AM  I have reviewed the triage vital signs and the nursing notes.   HISTORY  Chief Complaint Chest Pain      HPI Glenn Hill is a 70 y.o. male with history of aortic aneurysm and hypertension hyperlipidemia presents with acute onset of nonradiating midsternal chest pain 1 hour before presentation accompanied with shortness of breath that has since resolved. Patient states pain in the time was 7 out of 10. Patient denies any aggravating or alleviating factors.     Past Medical History  Diagnosis Date  . HLD (hyperlipidemia)   . Chest wall pain   . HTN (hypertension)   . Kidney stone   . BPH (benign prostatic hyperplasia)   . Microscopic hematuria   . AAA (abdominal aortic aneurysm) (HCC)   . Bladder wall thickening   . Elevated PSA     There are no active problems to display for this patient.   Past Surgical History  Procedure Laterality Date  . Leg circulation surgery Left     Current Outpatient Rx  Name  Route  Sig  Dispense  Refill  . aspirin EC 81 MG tablet   Oral   Take 81 mg by mouth daily.         Marland Kitchen losartan (COZAAR) 50 MG tablet   Oral   Take 50 mg by mouth daily.         . metoprolol succinate (TOPROL-XL) 50 MG 24 hr tablet   Oral   Take 50 mg by mouth daily. Take with or immediately following a meal.         . silodosin (RAPAFLO) 8 MG CAPS capsule   Oral   Take 8 mg by mouth daily with breakfast.         . simvastatin (ZOCOR) 20 MG tablet   Oral   Take 20 mg by mouth daily.         . tamsulosin (FLOMAX) 0.4 MG CAPS capsule   Oral   Take 0.4 mg by mouth daily.           Allergies Sulfa antibiotics  Family History  Problem Relation Age of Onset  . Dementia Father   . Alzheimer's disease Mother   . Heart attack Father     Social History Social History  Substance Use Topics  .  Smoking status: Current Every Day Smoker  . Smokeless tobacco: Not on file  . Alcohol Use: No    Review of Systems  Constitutional: Negative for fever. Eyes: Negative for visual changes. ENT: Negative for sore throat. Cardiovascular: Positive for chest pain. Respiratory: Positive for shortness of breath. Gastrointestinal: Negative for abdominal pain, vomiting and diarrhea. Genitourinary: Negative for dysuria. Musculoskeletal: Negative for back pain. Skin: Negative for rash. Neurological: Negative for headaches, focal weakness or numbness.   10-point ROS otherwise negative.  ____________________________________________   PHYSICAL EXAM:  VITAL SIGNS: ED Triage Vitals  Enc Vitals Group     BP 06/03/15 0213 149/79 mmHg     Pulse Rate 06/03/15 0213 66     Resp 06/03/15 0213 16     Temp 06/03/15 0213 97.8 F (36.6 C)     Temp Source 06/03/15 0213 Oral     SpO2 06/03/15 0213 97 %     Weight 06/03/15 0213 182 lb (82.555 kg)     Height 06/03/15 0213  (1.702 m)     Head Cir --  Peak Flow --      Pain Score 06/03/15 0216 4     Pain Loc --      Pain Edu? --      Excl. in GC? --      Constitutional: Alert and oriented. Well appearing and in no distress. Eyes: Conjunctivae are normal. PERRL. Normal extraocular movements. ENT   Head: Normocephalic and atraumatic.   Nose: No congestion/rhinnorhea.   Mouth/Throat: Mucous membranes are moist.   Neck: No stridor. Hematological/Lymphatic/Immunilogical: No cervical lymphadenopathy. Cardiovascular: Normal rate, regular rhythm. Normal and symmetric distal pulses are present in all extremities. No murmurs, rubs, or gallops. Respiratory: Normal respiratory effort without tachypnea nor retractions. Breath sounds are clear and equal bilaterally. No wheezes/rales/rhonchi. Gastrointestinal: Soft and nontender. No distention. There is no CVA tenderness. Genitourinary: deferred Musculoskeletal: Nontender with normal  range of motion in all extremities. No joint effusions.  No lower extremity tenderness nor edema. Neurologic:  Normal speech and language. No gross focal neurologic deficits are appreciated. Speech is normal.  Skin:  Skin is warm, dry and intact. No rash noted. Psychiatric: Mood and affect are normal. Speech and behavior are normal. Patient exhibits appropriate insight and judgment.  ____________________________________________    LABS (pertinent positives/negatives)  Labs Reviewed  BASIC METABOLIC PANEL - Abnormal; Notable for the following:    Glucose, Bld 118 (*)    BUN 21 (*)    Calcium 8.7 (*)    All other components within normal limits  CBC - Abnormal; Notable for the following:    RBC 4.35 (*)    HCT 37.7 (*)    RDW 15.0 (*)    All other components within normal limits  TROPONIN I     ____________________________________________   EKG  ED ECG REPORT I, Dunlap N Gwendy Boeder, the attending physician, personally viewed and interpreted this ECG.   Date: 06/04/2015  EKG Time: 2:17 AM  Rate: 64  Rhythm: Normal sinus rhythm  Axis: Normal  Intervals: Normal  ST&T Change: None   ____________________________________________    RADIOLOGY  DG Chest Portable 1 View (Final result) Result time: 06/03/15 02:58:40   Final result by Rad Results In Interface (06/03/15 02:58:40)   Narrative:   CLINICAL DATA: Chest pain  EXAM: PORTABLE CHEST 1 VIEW  COMPARISON: 02/26/2015  FINDINGS: There is chronic mild cardiomegaly with aortic tortuosity. There is no edema, consolidation, effusion, or pneumothorax. No osseous finding to explain acute chest pain.  IMPRESSION: No evidence of acute cardiopulmonary disease.   Electronically Signed By: Marnee Spring M.D. On: 06/03/2015 02:58        INITIAL IMPRESSION / ASSESSMENT AND PLAN / ED COURSE  Pertinent labs & imaging results that were available during my care of the patient were reviewed by me and  considered in my medical decision making (see chart for details).  Discussed the plan with the patient and performing a CT scan of the chest to evaluate the patient's aorta given history of aneurysm. In addition plan to obtain serial cardiac enzymes. Patient agreed to plan. After leaving the room I was informed by the nursing staff that the patient had eloped from the emergency department because he became very upset and irate during the IV start which I was told was uneventful and performed without difficulty. I went to the patient's room to discuss the beforementioned however he had already eloped from the emergency department.  ____________________________________________   FINAL CLINICAL IMPRESSION(S) / ED DIAGNOSES  Final diagnoses:  Chest pain, unspecified chest pain type  Darci Currentandolph N Sherrica Niehaus, MD 06/04/15 [phone]

## 2015-10-07 ENCOUNTER — Ambulatory Visit
Admission: RE | Admit: 2015-10-07 | Discharge: 2015-10-07 | Disposition: A | Discharge status: Home / Self Care | Payer: Medicare HMO | Source: Ambulatory Visit | Attending: Internal Medicine | Admitting: Internal Medicine

## 2015-10-07 ENCOUNTER — Other Ambulatory Visit: Payer: Self-pay | Admitting: Internal Medicine

## 2015-10-07 DIAGNOSIS — I517 Cardiomegaly: Secondary | ICD-10-CM | POA: Insufficient documentation

## 2015-10-07 DIAGNOSIS — J984 Other disorders of lung: Secondary | ICD-10-CM | POA: Diagnosis not present

## 2015-10-07 DIAGNOSIS — R079 Chest pain, unspecified: Secondary | ICD-10-CM | POA: Diagnosis not present

## 2015-10-07 DIAGNOSIS — R52 Pain, unspecified: Secondary | ICD-10-CM

## 2015-10-08 ENCOUNTER — Other Ambulatory Visit: Payer: Self-pay | Admitting: Internal Medicine

## 2015-10-08 DIAGNOSIS — R109 Unspecified abdominal pain: Secondary | ICD-10-CM

## 2015-10-14 ENCOUNTER — Ambulatory Visit: Payer: Medicare PPO

## 2015-12-29 ENCOUNTER — Ambulatory Visit: Payer: Medicare PPO | Admitting: Urology

## 2016-01-06 ENCOUNTER — Ambulatory Visit: Payer: Medicare PPO | Admitting: Urology

## 2016-03-30 ENCOUNTER — Ambulatory Visit (INDEPENDENT_AMBULATORY_CARE_PROVIDER_SITE_OTHER): Payer: Medicare HMO | Admitting: Vascular Surgery

## 2016-04-05 ENCOUNTER — Other Ambulatory Visit: Payer: Self-pay | Admitting: Vascular Surgery

## 2016-04-06 ENCOUNTER — Ambulatory Visit (INDEPENDENT_AMBULATORY_CARE_PROVIDER_SITE_OTHER): Payer: Medicare HMO | Admitting: Vascular Surgery

## 2016-04-06 ENCOUNTER — Encounter (INDEPENDENT_AMBULATORY_CARE_PROVIDER_SITE_OTHER): Payer: Self-pay | Admitting: Vascular Surgery

## 2016-04-06 VITALS — BP 165/85 | HR 68 | Resp 16 | Ht 67.0 in | Wt 186.0 lb

## 2016-04-06 DIAGNOSIS — I712 Thoracic aortic aneurysm, without rupture, unspecified: Secondary | ICD-10-CM | POA: Insufficient documentation

## 2016-04-06 DIAGNOSIS — E785 Hyperlipidemia, unspecified: Secondary | ICD-10-CM | POA: Diagnosis not present

## 2016-04-06 DIAGNOSIS — I10 Essential (primary) hypertension: Secondary | ICD-10-CM | POA: Insufficient documentation

## 2016-04-06 DIAGNOSIS — M79609 Pain in unspecified limb: Secondary | ICD-10-CM | POA: Insufficient documentation

## 2016-04-06 DIAGNOSIS — M79605 Pain in left leg: Secondary | ICD-10-CM | POA: Diagnosis not present

## 2016-04-06 DIAGNOSIS — I723 Aneurysm of iliac artery: Secondary | ICD-10-CM

## 2016-04-06 NOTE — Assessment & Plan Note (Signed)
Has had previous lower extremity revascularization is now describing pain in his left leg. His can certainly be related to perfusion, although he does not describe typical claudication symptoms. I will see him back for his aneurysmal disease and after we have evaluated that, we can perform studies of his lower extremity perfusion is well.

## 2016-04-06 NOTE — Progress Notes (Signed)
Patient ID: Glenn Hill, male   DOB: [birth year], 71 y.o.   MRN: [id number]  Chief Complaint  Patient presents with  . Follow-up    HPI Glenn Hill is a 71 y.o. male.  I am asked to see the patient by Dr. Dario Guardian for evaluation of a known thoracic aortic and iliac artery aneurysm. This has been at least 2 years since it was last checked. 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  The patient reports pain in his left leg at night that wakes him. He has had previous revascularization procedure on that left leg in the past. He denies any new back pain, abdominal pain, or signs of peripheral embolization. He continues to smoke. He has multiple other atherosclerotic risk factors as listed below. His primary care physician requested he get further evaluation of his aneurysmal disease at this time. He is in his usual state of health today.   Past Medical History:  Diagnosis Date  . AAA (abdominal aortic aneurysm) (HCC)   . Bladder wall thickening   . BPH (benign prostatic hyperplasia)   . Chest wall pain   . Elevated PSA   . HLD (hyperlipidemia)   . HTN (hypertension)   . Kidney stone   . Microscopic hematuria     Past Surgical History:  Procedure Laterality Date  . Leg circulation Surgery Left     Family History  Problem Relation Age of Onset  . Dementia Father   . Alzheimer's disease Mother   . Heart attack Father   NO bleeding disorders or clotting disorders  Social History Social History  Substance Use Topics  . Smoking status: Current Every Day Smoker  . Smokeless tobacco: Not on file  . Alcohol use No  Married No IVDU  Allergies  Allergen Reactions  . Sulfa Antibiotics     swelling    Current Outpatient Prescriptions  Medication Sig Dispense Refill  . amLODipine (NORVASC) 5 MG tablet Take by mouth.    Marland Kitchen aspirin  EC 81 MG tablet Take 81 mg by mouth daily.    . baclofen (LIORESAL) 10 MG tablet TAKE ONE TABLET BY MOUTH TWICE DAILY AS NEEDED    . cholecalciferol (VITAMIN D) 1000 units tablet Take 1,000 Units by mouth daily.    Marland Kitchen losartan (COZAAR) 50 MG tablet Take 50 mg by mouth daily.    . metoprolol succinate (TOPROL-XL) 50 MG 24 hr tablet Take 50 mg by mouth daily. Take with or immediately following a meal.    . omeprazole (PRILOSEC) 20 MG capsule Take 20 mg by mouth daily.    . simvastatin (ZOCOR) 20 MG tablet Take 20 mg by mouth daily.    . silodosin (RAPAFLO) 8 MG CAPS capsule Take 8 mg by mouth daily with breakfast.    . tamsulosin (FLOMAX) 0.4 MG CAPS capsule Take 0.4 mg by mouth daily.     No current facility-administered medications for this visit.       REVIEW OF SYSTEMS (Negative unless checked)  Constitutional: [] Weight loss  [] Fever  [] Chills Cardiac: [] Chest pain   [] Chest pressure   [] Palpitations   [] Shortness of breath when laying flat   [] Shortness of breath at rest   [] Shortness of breath with exertion. Vascular:  [] Pain in legs with walking   [] Pain in legs at rest   [] Pain  in legs when laying flat   [] Claudication   [] Pain in feet when walking  [x] Pain in feet at rest  [] Pain in feet when laying flat   [] History of DVT   [] Phlebitis   [] Swelling in legs   [] Varicose veins   [] Non-healing ulcers Pulmonary:   [] Uses home oxygen   [] Productive cough   [] Hemoptysis   [] Wheeze  [] COPD   [] Asthma Neurologic:  [] Dizziness  [] Blackouts   [] Seizures   [] History of stroke   [] History of TIA  [] Aphasia   [] Temporary blindness   [] Dysphagia   [] Weakness or numbness in arms   [] Weakness or numbness in legs Musculoskeletal:  [] Arthritis   [] Joint swelling   [] Joint pain   [x] Low back pain Hematologic:  [] Easy bruising  [] Easy bleeding   [] Hypercoagulable state   [] Anemic  [] Hepatitis Gastrointestinal:  [] Blood in stool   [] Vomiting blood  [] Gastroesophageal reflux/heartburn   [] Abdominal  pain Genitourinary:  [] Chronic kidney disease   [] Difficult urination  [] Frequent urination  [] Burning with urination   [] Hematuria Skin:  [] Rashes   [] Ulcers   [] Wounds Psychological:  [] History of anxiety   []  History of major depression.    Physical Exam BP (!) 165/85   Pulse 68   Resp 16   Ht 5\' 7"  (1.702 m)   Wt 186 lb (84.4 kg)   BMI 29.13 kg/m  Gen:  WD/WN, NAD Head: [place]/AT, No temporalis wasting. Prominent temp pulse not noted. Ear/Nose/Throat: Hearing grossly intact, nares w/o erythema or drainage, oropharynx w/o Erythema/Exudate Eyes: Conjunctiva clear, sclera non-icteric  Neck: trachea midline.  No JVD.  Pulmonary:  Good air movement, no use of accessory muscles Cardiac: RRR, normal S1, S2 Vascular:  Vessel Right Left  Radial Palpable Palpable  Ulnar Palpable Palpable  Brachial Palpable Palpable  Carotid Palpable, without bruit Palpable, without bruit  Aorta Not palpable N/A  Femoral Palpable Palpable  Popliteal Palpable Palpable  PT Palpable 1+ Palpable  DP 1+ Palpable 1+ Palpable   Gastrointestinal: soft, non-tender/non-distended. No guarding/reflex. No masses, surgical incisions, or scars. Musculoskeletal: M/S 5/5 throughout.  Extremities without ischemic changes.  No deformity or atrophy. no edema. Neurologic: Sensation grossly intact in extremities.  Symmetrical.  Speech is fluent. Motor exam as listed above. Psychiatric: Judgment intact, Mood & affect appropriate for pt's clinical situation. Dermatologic: No rashes or ulcers noted.  No cellulitis or open wounds. Lymph : No Cervical, Axillary, or Inguinal lymphadenopathy.   Radiology No results found.  Labs No results found for this or any previous visit (from the past 2160 hour(s)).  Assessment/Plan:  Essential hypertension, benign blood pressure control important in reducing the progression of atherosclerotic disease and aneurysmal disease. On appropriate oral medications.   Hyperlipidemia lipid  control important in reducing the progression of atherosclerotic disease. Continue statin therapy   Pain in limb Has had previous lower extremity revascularization is now describing pain in his left leg. His can certainly be related to perfusion, although he does not describe typical claudication symptoms. I will see him back for his aneurysmal disease and after we have evaluated that, we can perform studies of his lower extremity perfusion is well.  Thoracic aortic aneurysm [facility]) The last CT scan I see is from 2014 at which time his thoracic aortic aneurysms 4.4 cm. This clearly needs to be reimaged and we will schedule a CT scan of his chest as well as his abdomen and pelvis due to his iliac artery aneurysm as well. We will see him back following the  CT. We discussed the pathophysiology and natural history of aneurysmal disease. Blood pressure control and smoking cessation would be of benefit.  Aneurysm of iliac artery (HCC) The last CT scan I see is from 2014 at which time his thoracic aortic aneurysms 4.4 cm in his right iliac artery aneurysm was 2.6 cm. This clearly needs to be reimaged and we will schedule a CT scan of his chest as well as his abdomen and pelvis due to his iliac artery aneurysm as well. We will see him back following the CT. We discussed the pathophysiology and natural history of aneurysmal disease. Blood pressure control and smoking cessation would be of benefit.      Festus BarrenJason Dew 04/06/2016, 9:53 AM   This note was created with Dragon medical transcription system.  Any errors from dictation are unintentional.

## 2016-04-06 NOTE — Assessment & Plan Note (Signed)
The last CT scan I see is from 2014 at which time his thoracic aortic aneurysms 4.4 cm. This clearly needs to be reimaged and we will schedule a CT scan of his chest as well as his abdomen and pelvis due to his iliac artery aneurysm as well. We will see him back following the CT. We discussed the pathophysiology and natural history of aneurysmal disease. Blood pressure control and smoking cessation would be of benefit.

## 2016-04-06 NOTE — Assessment & Plan Note (Signed)
lipid control important in reducing the progression of atherosclerotic disease. Continue statin therapy  

## 2016-04-06 NOTE — Assessment & Plan Note (Signed)
blood pressure control important in reducing the progression of atherosclerotic disease and aneurysmal disease. On appropriate oral medications.  

## 2016-04-06 NOTE — Assessment & Plan Note (Signed)
The last CT scan I see is from 2014 at which time his thoracic aortic aneurysms 4.4 cm in his right iliac artery aneurysm was 2.6 cm. This clearly needs to be reimaged and we will schedule a CT scan of his chest as well as his abdomen and pelvis due to his iliac artery aneurysm as well. We will see him back following the CT. We discussed the pathophysiology and natural history of aneurysmal disease. Blood pressure control and smoking cessation would be of benefit.

## 2016-04-21 ENCOUNTER — Ambulatory Visit: Admission: RE | Admit: 2016-04-21 | Payer: Medicare HMO | Source: Ambulatory Visit

## 2016-04-23 ENCOUNTER — Ambulatory Visit (INDEPENDENT_AMBULATORY_CARE_PROVIDER_SITE_OTHER): Payer: Medicare HMO | Admitting: Vascular Surgery

## 2016-08-26 ENCOUNTER — Encounter: Payer: Self-pay | Admitting: Family Medicine

## 2016-08-26 ENCOUNTER — Ambulatory Visit (INDEPENDENT_AMBULATORY_CARE_PROVIDER_SITE_OTHER): Payer: Medicare HMO | Admitting: Family Medicine

## 2016-08-26 VITALS — BP 146/86 | HR 59 | Temp 97.8°F | Resp 16 | Wt 181.5 lb

## 2016-08-26 DIAGNOSIS — I712 Thoracic aortic aneurysm, without rupture, unspecified: Secondary | ICD-10-CM

## 2016-08-26 DIAGNOSIS — N401 Enlarged prostate with lower urinary tract symptoms: Secondary | ICD-10-CM

## 2016-08-26 DIAGNOSIS — R350 Frequency of micturition: Secondary | ICD-10-CM | POA: Diagnosis not present

## 2016-08-26 DIAGNOSIS — Z720 Tobacco use: Secondary | ICD-10-CM | POA: Insufficient documentation

## 2016-08-26 DIAGNOSIS — I10 Essential (primary) hypertension: Secondary | ICD-10-CM | POA: Diagnosis not present

## 2016-08-26 DIAGNOSIS — E785 Hyperlipidemia, unspecified: Secondary | ICD-10-CM | POA: Diagnosis not present

## 2016-08-26 DIAGNOSIS — N400 Enlarged prostate without lower urinary tract symptoms: Secondary | ICD-10-CM | POA: Insufficient documentation

## 2016-08-26 MED ORDER — SILODOSIN 8 MG PO CAPS: 8.0000 mg | ORAL_CAPSULE | Freq: Every day | ORAL | 1 refills | Status: DC

## 2016-08-26 MED ORDER — BUPROPION HCL ER (XL) 150 MG PO TB24: 150.0000 mg | ORAL_TABLET | Freq: Every day | ORAL | 1 refills | Status: DC

## 2016-08-26 NOTE — Assessment & Plan Note (Signed)
Advised to quit. Discussed Wellbutrin. Will send in.

## 2016-08-26 NOTE — Assessment & Plan Note (Signed)
Unsure of control. Will obtain records. Unless his cholesterol is very well controlled, he needs to be on high intensity statin. Continue Zocor for now.

## 2016-08-26 NOTE — Assessment & Plan Note (Signed)
BP elevated today. Patient reluctant to make medication changes. Advised to check BP at home.  Continue current meds.

## 2016-08-26 NOTE — Assessment & Plan Note (Signed)
Currently uncontrolled. Restarting Rapaflo.

## 2016-08-26 NOTE — Patient Instructions (Signed)
Continue your current medications.  Check BP at home.   Follow up in 1 month.  Call and schedule follow up with Dr. Wyn Quakerew.  Take care  Dr. Adriana Simasook

## 2016-08-26 NOTE — Progress Notes (Signed)
Subjective:  Patient ID: Glenn Hill, male    DOB: Sep 25, 1945  Age: 71 y.o. MRN: [id number]  CC: Establish care  HPI Glenn Hill is a 71 y.o. male presents to the clinic today to establish care. Issues are below.  HTN  BP elevated today.  Patient is currently on amlodipine 5 mg daily, losartan 50 mg daily, and metoprolol 50 mg daily.  Denies chest pain.  Does have some shortness of breath at times.  Reports lower extremity edema.  Endorses compliance with medications.  Hyperlipidemia  Unsure of control.  Need to obtain records.  Currently on Simvastatin 20 mg daily.  Aneurysm  Has not followed up with passive surgery. Has aortic and iliac artery aneurysms.  Needs to quit smoking. Needs aggressive control of BP, blood sugar, and lipids.  Tobacco abuse  Long-standing.  Could not afford Chantix.  Will discuss treatment options today.  BPH  He is not currently taking his medications and I'm unsure why. He is also unsure why. He was previously on Flomax and has also been on Rapaflo.  He is having significant symptoms with frequency and decreased stream. He is also had incontinence.  PMH, Surgical Hx, Family Hx, Social History reviewed and updated as below.  Past Medical History:  Diagnosis Date  . AAA (abdominal aortic aneurysm) (HCC)   . BPH (benign prostatic hyperplasia)   . Elevated PSA   . HLD (hyperlipidemia)   . HTN (hypertension)   . Kidney stones    Past Surgical History:  Procedure Laterality Date  . Leg circulation Surgery Left    Family History  Problem Relation Age of Onset  . Dementia Father   . Heart attack Father   . Alzheimer's disease Mother    Social History  Substance Use Topics  . Smoking status: Current Every Day Smoker    Packs/day: 0.50    Years: 55.00    Types: Cigarettes  . Smokeless tobacco: Never Used  . Alcohol use No   Review of Systems  HENT: Positive for tinnitus.   Genitourinary: Positive for  frequency.  Musculoskeletal:       Right elbow pain.  Neurological: Positive for numbness.  Psychiatric/Behavioral:       Sadness/stress.   All other systems reviewed and are negative.   Objective:   Today's Vitals: BP (!) 152/84   Pulse (!) 59   Temp 97.8 F (36.6 C) (Oral)   Resp 16   Wt 181 lb 8 oz (82.3 kg)   SpO2 98%   BMI 28.43 kg/m   Physical Exam  Constitutional: He is oriented to person, place, and time. He appears well-developed. No distress.  HENT:  Head: Normocephalic and atraumatic.  Mouth/Throat: Oropharynx is clear and moist.  Eyes: Conjunctivae are normal.  Neck: Neck supple. No thyromegaly present.  Cardiovascular: Normal rate and regular rhythm.   Pulmonary/Chest: Effort normal and breath sounds normal. He has no wheezes. He has no rales.  Abdominal: Soft. He exhibits no distension. There is no tenderness. There is no rebound and no guarding.  Neurological: He is alert and oriented to person, place, and time.  Skin: Skin is warm. No rash noted.  Psychiatric: He has a normal mood and affect.  Vitals reviewed.  Assessment & Plan:   Problem List Items Addressed This Visit      Cardiovascular and Mediastinum   Thoracic aortic aneurysm (HCC)    Advise quit smoking. Also advised that he needs aggressive control of blood pressure and  lipids. Patient reluctant to make changes at this time. We'll await records regarding laboratory studies.      Essential hypertension, benign - Primary    BP elevated today. Patient reluctant to make medication changes. Advised to check BP at home.  Continue current meds.         Genitourinary   BPH (benign prostatic hyperplasia)    Currently uncontrolled. Restarting Rapaflo.      Relevant Medications   silodosin (RAPAFLO) 8 MG CAPS capsule     Other   Tobacco abuse    Advised to quit. Discussed Wellbutrin. Will send in.      Hyperlipidemia    Unsure of control. Will obtain records. Unless his cholesterol  is very well controlled, he needs to be on high intensity statin. Continue Zocor for now.         Meds ordered this encounter  Medications  . meloxicam (MOBIC) 15 MG tablet    Sig: Mobic 15 mg tablet  Take 1 tablet every day by oral route with meals.  Marland Kitchen. DISCONTD: silodosin (RAPAFLO) 8 MG CAPS capsule    Sig: Take 1 capsule (8 mg total) by mouth daily with breakfast.    Dispense:  90 capsule    Refill:  1  . silodosin (RAPAFLO) 8 MG CAPS capsule    Sig: Take 1 capsule (8 mg total) by mouth daily with breakfast.    Dispense:  90 capsule    Refill:  1  . buPROPion (WELLBUTRIN XL) 150 MG 24 hr tablet    Sig: Take 1 tablet (150 mg total) by mouth daily.    Dispense:  90 tablet    Refill:  1     Follow-up: 1 month.  Everlene OtherJayce Ileah Falkenstein DO [facility] [place]

## 2016-08-26 NOTE — Assessment & Plan Note (Signed)
Advise quit smoking. Also advised that he needs aggressive control of blood pressure and lipids. Patient reluctant to make changes at this time. We'll await records regarding laboratory studies.

## 2016-09-02 ENCOUNTER — Telehealth: Payer: Self-pay | Admitting: *Deleted

## 2016-09-02 ENCOUNTER — Other Ambulatory Visit: Payer: Self-pay | Admitting: Family Medicine

## 2016-09-02 NOTE — Telephone Encounter (Signed)
Is he referring to the Rapaflo (the medication for his prostate)?

## 2016-09-02 NOTE — Telephone Encounter (Signed)
The rapaflo that was prescribed on 6/7 was too expensive, please advise for a different medication. thanks

## 2016-09-02 NOTE — Telephone Encounter (Signed)
Patient stated that the script for lioresal was expensive, this medication has no generic. Patient is requesting to have a replacement medication less expensive. [facility] [place]

## 2016-09-02 NOTE — Telephone Encounter (Signed)
Yes,

## 2016-09-03 ENCOUNTER — Other Ambulatory Visit: Payer: Self-pay | Admitting: Family Medicine

## 2016-09-03 MED ORDER — TAMSULOSIN HCL 0.4 MG PO CAPS: 0.4000 mg | ORAL_CAPSULE | Freq: Every day | ORAL | 3 refills | Status: DC

## 2016-09-21 ENCOUNTER — Ambulatory Visit (INDEPENDENT_AMBULATORY_CARE_PROVIDER_SITE_OTHER): Payer: Medicare HMO | Admitting: Vascular Surgery

## 2016-09-27 ENCOUNTER — Other Ambulatory Visit: Payer: Self-pay

## 2016-09-27 NOTE — Telephone Encounter (Signed)
Please advise on refill, these are historical for you, next appt on 10/07/16. Thanks

## 2016-09-28 MED ORDER — AMLODIPINE BESYLATE 5 MG PO TABS: 5.0000 mg | ORAL_TABLET | Freq: Every day | ORAL | 1 refills | Status: AC

## 2016-09-30 ENCOUNTER — Ambulatory Visit: Payer: Medicare HMO | Admitting: Family Medicine

## 2016-10-05 ENCOUNTER — Ambulatory Visit (INDEPENDENT_AMBULATORY_CARE_PROVIDER_SITE_OTHER): Payer: Medicare HMO | Admitting: Vascular Surgery

## 2016-10-07 ENCOUNTER — Ambulatory Visit: Payer: Medicare HMO | Admitting: Family Medicine

## 2016-11-25 ENCOUNTER — Encounter: Payer: Self-pay | Admitting: Family Medicine

## 2016-11-25 ENCOUNTER — Ambulatory Visit (INDEPENDENT_AMBULATORY_CARE_PROVIDER_SITE_OTHER): Payer: Medicare HMO | Admitting: Family Medicine

## 2016-11-25 VITALS — BP 172/98 | HR 72 | Temp 98.3°F | Resp 18 | Wt 189.2 lb

## 2016-11-25 DIAGNOSIS — E785 Hyperlipidemia, unspecified: Secondary | ICD-10-CM

## 2016-11-25 DIAGNOSIS — I10 Essential (primary) hypertension: Secondary | ICD-10-CM

## 2016-11-25 DIAGNOSIS — R739 Hyperglycemia, unspecified: Secondary | ICD-10-CM | POA: Diagnosis not present

## 2016-11-25 DIAGNOSIS — N401 Enlarged prostate with lower urinary tract symptoms: Secondary | ICD-10-CM

## 2016-11-25 DIAGNOSIS — Z720 Tobacco use: Secondary | ICD-10-CM

## 2016-11-25 DIAGNOSIS — Z130 Encounter for screening for diseases of the blood and blood-forming organs and certain disorders involving the immune mechanism: Secondary | ICD-10-CM | POA: Diagnosis not present

## 2016-11-25 DIAGNOSIS — Z23 Encounter for immunization: Secondary | ICD-10-CM | POA: Diagnosis not present

## 2016-11-25 DIAGNOSIS — R350 Frequency of micturition: Secondary | ICD-10-CM

## 2016-11-25 LAB — LIPID PANEL
CHOL/HDL RATIO: 6
Cholesterol: 182 mg/dL (ref 0–200)
HDL: 30.2 mg/dL — AB (ref >39.00)
NONHDL: 151.32
Triglycerides: 337 mg/dL — ABNORMAL HIGH (ref 0.0–149.0)
VLDL: 67.4 mg/dL — AB (ref 0.0–40.0)

## 2016-11-25 LAB — CBC
HCT: 39.7 % (ref 39.0–52.0)
Hemoglobin: 13.5 g/dL (ref 13.0–17.0)
MCHC: 34.1 g/dL (ref 30.0–36.0)
MCV: 89.4 fl (ref 78.0–100.0)
Platelets: 258 10*3/uL (ref 150.0–400.0)
RBC: 4.44 Mil/uL (ref 4.22–5.81)
RDW: 14.7 % (ref 11.5–15.5)
WBC: 7.4 10*3/uL (ref 4.0–10.5)

## 2016-11-25 LAB — COMPREHENSIVE METABOLIC PANEL
ALT: 20 U/L (ref 0–53)
AST: 17 U/L (ref 0–37)
Albumin: 4.2 g/dL (ref 3.5–5.2)
Alkaline Phosphatase: 76 U/L (ref 39–117)
BUN: 16 mg/dL (ref 6–23)
CHLORIDE: 108 meq/L (ref 96–112)
CO2: 23 meq/L (ref 19–32)
Calcium: 9.2 mg/dL (ref 8.4–10.5)
Creatinine, Ser: 0.99 mg/dL (ref 0.40–1.50)
GFR: 79.55 mL/min (ref >60.00)
GLUCOSE: 105 mg/dL — AB (ref 70–99)
POTASSIUM: 3.9 meq/L (ref 3.5–5.1)
SODIUM: 140 meq/L (ref 135–145)
TOTAL PROTEIN: 6.9 g/dL (ref 6.0–8.3)
Total Bilirubin: 0.5 mg/dL (ref 0.2–1.2)

## 2016-11-25 LAB — LDL CHOLESTEROL, DIRECT: LDL DIRECT: 116 mg/dL

## 2016-11-25 LAB — HEMOGLOBIN A1C: HEMOGLOBIN A1C: 5.6 % (ref 4.6–6.5)

## 2016-11-25 NOTE — Assessment & Plan Note (Signed)
Stable on Flomax. 

## 2016-11-25 NOTE — Assessment & Plan Note (Signed)
Uncontrolled. Due to non compliance.  Labs today. Advised compliance with Norvasc, Metoprolol, and Losartan.

## 2016-11-25 NOTE — Progress Notes (Signed)
Subjective:  Patient ID: Melchor AmourChristopher R Ramus, male    DOB: 06/26/1945  Age: 71 y.o. MRN: [id number]  CC: Follow up  HPI:  71 year old male with hypertension, HLD, tobacco abuse, thoracic aortic aneurysm, BPH presents for follow-up.  Hypertension  Patient's blood pressure is elevated today.  Patient states that he did not take his medication last night/day.  He is currently on amlodipine, metoprolol, losartan.  HLD  Unsure of control. He is currently on simvastatin. He needs a lipid panel today.  Tobacco abuse  Continues to smoke. Patient not ready to quit. States he cannot afford to take Chantix.  BPH  Stable on Flomax.  Social Hx   Social History   Social History  . Marital status: Married    Spouse name: N/A  . Number of children: N/A  . Years of education: N/A   Social History Main Topics  . Smoking status: Current Every Day Smoker    Packs/day: 0.50    Years: 55.00    Types: Cigarettes  . Smokeless tobacco: Never Used  . Alcohol use No  . Drug use: No  . Sexual activity: No   Other Topics Concern  . None   Social History Narrative  . None    Review of Systems  Constitutional: Negative.   Respiratory: Negative.   Cardiovascular: Negative.    Objective:  BP (!) 172/98 (BP Location: Left Arm, Patient Position: Sitting, Cuff Size: Normal)   Pulse 72   Temp 98.3 F (36.8 C) (Oral)   Resp 18   Wt 189 lb 4 oz (85.8 kg)   SpO2 97%   BMI 29.64 kg/m   BP/Weight 11/25/2016 08/26/2016 04/06/2016  Systolic BP 172 146 165  Diastolic BP 98 86 85  Wt. (Lbs) 189.25 181.5 186  BMI 29.64 28.43 29.13    Physical Exam  Constitutional: He is oriented to person, place, and time. He appears well-developed. No distress.  Cardiovascular: Normal rate and regular rhythm.   No murmur heard. Pulmonary/Chest: Effort normal. He has no wheezes. He has no rales.  Abdominal: Soft. He exhibits no distension. There is no tenderness. There is no rebound and no  guarding.  Neurological: He is alert and oriented to person, place, and time.  Psychiatric: He has a normal mood and affect.  Vitals reviewed.   Lab Results  Component Value Date   WBC 8.9 06/03/2015   HGB 13.1 06/03/2015   HCT 37.7 (L) 06/03/2015   PLT 152 06/03/2015   GLUCOSE 118 (H) 06/03/2015   CHOL 181 04/23/2012   TRIG 175 04/23/2012   HDL 25 (L) 04/23/2012   LDLCALC 121 (H) 04/23/2012   NA 139 06/03/2015   K 3.8 06/03/2015   CL 111 06/03/2015   CREATININE 1.03 06/03/2015   BUN 21 (H) 06/03/2015   CO2 22 06/03/2015    Assessment & Plan:   Problem List Items Addressed This Visit    Essential hypertension, benign - Primary    Uncontrolled. Due to non compliance.  Labs today. Advised compliance with Norvasc, Metoprolol, and Losartan.      Relevant Orders   Comprehensive metabolic panel   Hyperlipidemia    Unsure of control. Lipid panel today. Continue simvastatin. Pending lipid panel, would benefit from high intensity statin given AAA.      Relevant Orders   Lipid panel   BPH (benign prostatic hyperplasia)    Stable on Flomax.      Relevant Medications   tamsulosin (FLOMAX) 0.4 MG CAPS capsule  Tobacco abuse    Advised cessation.       Other Visit Diagnoses    Screening for deficiency anemia       Relevant Orders   CBC   Blood glucose elevated       Relevant Orders   Hemoglobin A1c   Encounter for immunization       Relevant Orders   Flu vaccine HIGH DOSE PF (Completed)   Need for pneumococcal vaccination       Relevant Orders   Pneumococcal conjugate vaccine 13-valent IM (Completed)      Meds ordered this encounter  Medications  . tamsulosin (FLOMAX) 0.4 MG CAPS capsule    Sig: Take 0.4 mg by mouth daily.     Follow-up: Return in about 2 weeks (around 12/09/2016) for BP check - Nurse visit.  Everlene Other DO [facility]

## 2016-11-25 NOTE — Assessment & Plan Note (Signed)
Advised cessation 

## 2016-11-25 NOTE — Patient Instructions (Signed)
Stop the amlodipine. BP check in 2 weeks.  Be sure to take your BP medications.  We will call with your lab results.  Take care  Dr. Adriana Simasook

## 2016-11-25 NOTE — Assessment & Plan Note (Signed)
Unsure of control. Lipid panel today. Continue simvastatin. Pending lipid panel, would benefit from high intensity statin given AAA.

## 2016-11-26 ENCOUNTER — Other Ambulatory Visit: Payer: Self-pay | Admitting: Family Medicine

## 2016-11-26 MED ORDER — ROSUVASTATIN CALCIUM 20 MG PO TABS: 20.0000 mg | ORAL_TABLET | Freq: Every day | ORAL | 3 refills | Status: AC

## 2016-12-09 ENCOUNTER — Encounter: Payer: Self-pay | Admitting: *Deleted

## 2016-12-09 ENCOUNTER — Ambulatory Visit (INDEPENDENT_AMBULATORY_CARE_PROVIDER_SITE_OTHER): Payer: Medicare HMO | Admitting: *Deleted

## 2016-12-09 VITALS — BP 140/88 | HR 68 | Resp 18

## 2016-12-09 DIAGNOSIS — I10 Essential (primary) hypertension: Secondary | ICD-10-CM | POA: Diagnosis not present

## 2016-12-09 NOTE — Progress Notes (Signed)
Patient presented for recheck on BP from 11/25/16 office visit, and patient states he has been compliant with medication takes before going to work third shift. BP left arm 136/90 pulse 68, after proper time BP right arm 140/88 pulse 68 .

## 2016-12-09 NOTE — Progress Notes (Signed)
Stable. Continue medication.  Everlene Other DO [facility]

## 2016-12-16 ENCOUNTER — Other Ambulatory Visit: Payer: Self-pay | Admitting: Family Medicine

## 2016-12-16 NOTE — Telephone Encounter (Signed)
Pt called requesting a refill on his metoprolol succinate (TOPROL-XL) 50 MG 24 hr tablet.. Please advise, thank yu!  Pharmacy - [id number] - [place] [facility] [place] - [address]

## 2016-12-17 MED ORDER — METOPROLOL SUCCINATE ER 50 MG PO TB24: 50.0000 mg | ORAL_TABLET | Freq: Every day | ORAL | 3 refills | Status: AC

## 2016-12-17 NOTE — Telephone Encounter (Signed)
Last office visit 11/25/16, historical medication , we've never filled No office visit scheduled Blood pressure check on 9/201/18

## 2016-12-17 NOTE — Telephone Encounter (Signed)
Sent to pharmacy. It appears Dr. Adriana Simas discussed this medication with the patient in his last visit.

## 2017-03-24 ENCOUNTER — Encounter: Payer: Self-pay | Admitting: Emergency Medicine

## 2017-03-24 ENCOUNTER — Inpatient Hospital Stay
Admission: EM | Admit: 2017-03-24 | Discharge: 2017-03-25 | DRG: 303 | Disposition: A | Discharge status: Home / Self Care | Payer: Medicare HMO | Attending: Internal Medicine | Admitting: Internal Medicine

## 2017-03-24 ENCOUNTER — Other Ambulatory Visit: Payer: Self-pay

## 2017-03-24 ENCOUNTER — Emergency Department: Payer: Medicare HMO

## 2017-03-24 DIAGNOSIS — Z9119 Patient's noncompliance with other medical treatment and regimen: Secondary | ICD-10-CM

## 2017-03-24 DIAGNOSIS — I160 Hypertensive urgency: Secondary | ICD-10-CM | POA: Diagnosis present

## 2017-03-24 DIAGNOSIS — I714 Abdominal aortic aneurysm, without rupture: Secondary | ICD-10-CM | POA: Diagnosis present

## 2017-03-24 DIAGNOSIS — R079 Chest pain, unspecified: Secondary | ICD-10-CM | POA: Diagnosis not present

## 2017-03-24 DIAGNOSIS — I739 Peripheral vascular disease, unspecified: Secondary | ICD-10-CM | POA: Diagnosis present

## 2017-03-24 DIAGNOSIS — R911 Solitary pulmonary nodule: Secondary | ICD-10-CM | POA: Diagnosis present

## 2017-03-24 DIAGNOSIS — I25110 Atherosclerotic heart disease of native coronary artery with unstable angina pectoris: Secondary | ICD-10-CM | POA: Diagnosis present

## 2017-03-24 DIAGNOSIS — F17210 Nicotine dependence, cigarettes, uncomplicated: Secondary | ICD-10-CM | POA: Diagnosis present

## 2017-03-24 DIAGNOSIS — R9431 Abnormal electrocardiogram [ECG] [EKG]: Secondary | ICD-10-CM | POA: Diagnosis present

## 2017-03-24 DIAGNOSIS — I716 Thoracoabdominal aortic aneurysm, without rupture: Secondary | ICD-10-CM | POA: Diagnosis present

## 2017-03-24 DIAGNOSIS — Z791 Long term (current) use of non-steroidal anti-inflammatories (NSAID): Secondary | ICD-10-CM | POA: Diagnosis not present

## 2017-03-24 DIAGNOSIS — Z7982 Long term (current) use of aspirin: Secondary | ICD-10-CM | POA: Diagnosis not present

## 2017-03-24 DIAGNOSIS — I10 Essential (primary) hypertension: Secondary | ICD-10-CM | POA: Diagnosis present

## 2017-03-24 DIAGNOSIS — I712 Thoracic aortic aneurysm, without rupture: Secondary | ICD-10-CM | POA: Diagnosis present

## 2017-03-24 DIAGNOSIS — I723 Aneurysm of iliac artery: Secondary | ICD-10-CM | POA: Diagnosis present

## 2017-03-24 DIAGNOSIS — E785 Hyperlipidemia, unspecified: Secondary | ICD-10-CM | POA: Diagnosis present

## 2017-03-24 DIAGNOSIS — Z79899 Other long term (current) drug therapy: Secondary | ICD-10-CM

## 2017-03-24 DIAGNOSIS — Z882 Allergy status to sulfonamides status: Secondary | ICD-10-CM | POA: Diagnosis not present

## 2017-03-24 DIAGNOSIS — R7989 Other specified abnormal findings of blood chemistry: Secondary | ICD-10-CM | POA: Diagnosis present

## 2017-03-24 DIAGNOSIS — Z5329 Procedure and treatment not carried out because of patient's decision for other reasons: Secondary | ICD-10-CM | POA: Diagnosis not present

## 2017-03-24 DIAGNOSIS — N400 Enlarged prostate without lower urinary tract symptoms: Secondary | ICD-10-CM | POA: Diagnosis present

## 2017-03-24 LAB — PROTIME-INR
INR: 1.07
PROTHROMBIN TIME: 13.8 s (ref 11.4–15.2)

## 2017-03-24 LAB — BASIC METABOLIC PANEL
ANION GAP: 9 (ref 5–15)
BUN: 23 mg/dL — ABNORMAL HIGH (ref 6–20)
CHLORIDE: 109 mmol/L (ref 101–111)
CO2: 22 mmol/L (ref 22–32)
CREATININE: 1.1 mg/dL (ref 0.61–1.24)
Calcium: 8.9 mg/dL (ref 8.9–10.3)
GFR calc non Af Amer: >60 mL/min (ref >60)
Glucose, Bld: 119 mg/dL — ABNORMAL HIGH (ref 65–99)
Potassium: 4 mmol/L (ref 3.5–5.1)
SODIUM: 140 mmol/L (ref 135–145)

## 2017-03-24 LAB — HEMOGLOBIN A1C
Hgb A1c MFr Bld: 5.6 % (ref 4.8–5.6)
MEAN PLASMA GLUCOSE: 114.02 mg/dL

## 2017-03-24 LAB — LIPID PANEL
CHOLESTEROL: 162 mg/dL (ref 0–200)
HDL: 33 mg/dL — AB (ref >40)
LDL Cholesterol: 71 mg/dL (ref 0–99)
Total CHOL/HDL Ratio: 4.9 RATIO
Triglycerides: 289 mg/dL — ABNORMAL HIGH (ref <150)
VLDL: 58 mg/dL — ABNORMAL HIGH (ref 0–40)

## 2017-03-24 LAB — CBC
HEMATOCRIT: 39.1 % — AB (ref 40.0–52.0)
HEMOGLOBIN: 13.6 g/dL (ref 13.0–18.0)
MCH: 30.1 pg (ref 26.0–34.0)
MCHC: 34.9 g/dL (ref 32.0–36.0)
MCV: 86.2 fL (ref 80.0–100.0)
Platelets: 221 10*3/uL (ref 150–440)
RBC: 4.53 MIL/uL (ref 4.40–5.90)
RDW: 15.7 % — ABNORMAL HIGH (ref 11.5–14.5)
WBC: 6.1 10*3/uL (ref 3.8–10.6)

## 2017-03-24 LAB — TROPONIN I
TROPONIN I: 0.09 ng/mL — AB (ref <0.03)
TROPONIN I: 0.82 ng/mL — AB (ref <0.03)
Troponin I: 0.34 ng/mL (ref <0.03)

## 2017-03-24 LAB — APTT: APTT: 49 s — AB (ref 24–36)

## 2017-03-24 LAB — TSH: TSH: 1.949 u[IU]/mL (ref 0.350–4.500)

## 2017-03-24 MED ORDER — ONDANSETRON HCL 4 MG/2ML IJ SOLN: 4.0000 mg | Freq: Four times a day (QID) | INTRAMUSCULAR | Status: DC | PRN

## 2017-03-24 MED ORDER — PANTOPRAZOLE SODIUM 40 MG PO TBEC
40.0000 mg | DELAYED_RELEASE_TABLET | Freq: Every day | ORAL | Status: DC
  Administered 2017-03-24 – 2017-03-25 (×2): 40 mg via ORAL
  Filled 2017-03-24 (×2): qty 1

## 2017-03-24 MED ORDER — ONDANSETRON HCL 4 MG PO TABS: 4.0000 mg | ORAL_TABLET | Freq: Four times a day (QID) | ORAL | Status: DC | PRN

## 2017-03-24 MED ORDER — VITAMIN D 1000 UNITS PO TABS
1000.0000 [IU] | ORAL_TABLET | Freq: Every day | ORAL | Status: DC
  Administered 2017-03-24 – 2017-03-25 (×2): 1000 [IU] via ORAL
  Filled 2017-03-24 (×2): qty 1

## 2017-03-24 MED ORDER — ASPIRIN 81 MG PO CHEW
324.0000 mg | CHEWABLE_TABLET | Freq: Once | ORAL | Status: AC
  Administered 2017-03-24: 324 mg via ORAL
  Filled 2017-03-24: qty 4

## 2017-03-24 MED ORDER — HYDRALAZINE HCL 20 MG/ML IJ SOLN
10.0000 mg | Freq: Four times a day (QID) | INTRAMUSCULAR | Status: DC | PRN
  Administered 2017-03-25: 10 mg via INTRAVENOUS
  Filled 2017-03-24: qty 1

## 2017-03-24 MED ORDER — TAMSULOSIN HCL 0.4 MG PO CAPS
0.4000 mg | ORAL_CAPSULE | Freq: Every day | ORAL | Status: DC
  Administered 2017-03-24 – 2017-03-25 (×2): 0.4 mg via ORAL
  Filled 2017-03-24 (×2): qty 1

## 2017-03-24 MED ORDER — HEPARIN BOLUS VIA INFUSION
4000.0000 [IU] | Freq: Once | INTRAVENOUS | Status: AC
  Administered 2017-03-24: 4000 [IU] via INTRAVENOUS
  Filled 2017-03-24: qty 4000

## 2017-03-24 MED ORDER — HYDRALAZINE HCL 20 MG/ML IJ SOLN
INTRAMUSCULAR | Status: AC
  Administered 2017-03-24: 10 mg via INTRAVENOUS
  Filled 2017-03-24: qty 1

## 2017-03-24 MED ORDER — ENOXAPARIN SODIUM 40 MG/0.4ML ~~LOC~~ SOLN: 40.0000 mg | SUBCUTANEOUS | Status: DC

## 2017-03-24 MED ORDER — LOSARTAN POTASSIUM 50 MG PO TABS
50.0000 mg | ORAL_TABLET | Freq: Every day | ORAL | Status: DC
  Administered 2017-03-24 – 2017-03-25 (×2): 50 mg via ORAL
  Filled 2017-03-24: qty 1

## 2017-03-24 MED ORDER — ACETAMINOPHEN 325 MG PO TABS
650.0000 mg | ORAL_TABLET | Freq: Four times a day (QID) | ORAL | Status: DC | PRN
  Administered 2017-03-24 – 2017-03-25 (×3): 650 mg via ORAL
  Filled 2017-03-24 (×3): qty 2

## 2017-03-24 MED ORDER — HEPARIN (PORCINE) IN NACL 100-0.45 UNIT/ML-% IJ SOLN
1000.0000 [IU]/h | INTRAMUSCULAR | Status: DC
  Administered 2017-03-24: 1000 [IU]/h via INTRAVENOUS
  Filled 2017-03-24 (×2): qty 250

## 2017-03-24 MED ORDER — HYDRALAZINE HCL 20 MG/ML IJ SOLN
10.0000 mg | Freq: Once | INTRAMUSCULAR | Status: AC
  Administered 2017-03-24: 10 mg via INTRAVENOUS

## 2017-03-24 MED ORDER — LOSARTAN POTASSIUM 50 MG PO TABS
ORAL_TABLET | ORAL | Status: AC
  Filled 2017-03-24: qty 1

## 2017-03-24 MED ORDER — IOPAMIDOL (ISOVUE-370) INJECTION 76%
75.0000 mL | Freq: Once | INTRAVENOUS | Status: AC | PRN
  Administered 2017-03-24: 100 mL via INTRAVENOUS

## 2017-03-24 MED ORDER — AMLODIPINE BESYLATE 5 MG PO TABS
ORAL_TABLET | ORAL | Status: AC
  Administered 2017-03-24: 5 mg via ORAL
  Filled 2017-03-24: qty 1

## 2017-03-24 MED ORDER — AMLODIPINE BESYLATE 5 MG PO TABS
5.0000 mg | ORAL_TABLET | Freq: Every day | ORAL | Status: DC
  Administered 2017-03-24 – 2017-03-25 (×2): 5 mg via ORAL
  Filled 2017-03-24: qty 1

## 2017-03-24 MED ORDER — ACETAMINOPHEN 650 MG RE SUPP: 650.0000 mg | Freq: Four times a day (QID) | RECTAL | Status: DC | PRN

## 2017-03-24 MED ORDER — ASPIRIN EC 81 MG PO TBEC
81.0000 mg | DELAYED_RELEASE_TABLET | Freq: Every day | ORAL | Status: DC
  Administered 2017-03-25: 81 mg via ORAL
  Filled 2017-03-24: qty 1

## 2017-03-24 MED ORDER — MELOXICAM 7.5 MG PO TABS
15.0000 mg | ORAL_TABLET | Freq: Every day | ORAL | Status: DC
  Administered 2017-03-24 – 2017-03-25 (×2): 15 mg via ORAL
  Filled 2017-03-24 (×2): qty 2

## 2017-03-24 MED ORDER — ROSUVASTATIN CALCIUM 10 MG PO TABS
20.0000 mg | ORAL_TABLET | Freq: Every day | ORAL | Status: DC
  Administered 2017-03-24 – 2017-03-25 (×2): 20 mg via ORAL
  Filled 2017-03-24 (×2): qty 2

## 2017-03-24 MED ORDER — NITROGLYCERIN 2 % TD OINT
0.5000 [in_us] | TOPICAL_OINTMENT | Freq: Four times a day (QID) | TRANSDERMAL | Status: DC
  Filled 2017-03-24: qty 1

## 2017-03-24 MED ORDER — METOPROLOL SUCCINATE ER 50 MG PO TB24
50.0000 mg | ORAL_TABLET | Freq: Every day | ORAL | Status: DC
  Administered 2017-03-24 – 2017-03-25 (×2): 50 mg via ORAL
  Filled 2017-03-24 (×2): qty 1

## 2017-03-24 NOTE — ED Notes (Signed)
PT placed back on monitor. Pts family member using wall oxygen at this time per request due to low personal O2 levels. PT remains in NAD at this time.

## 2017-03-24 NOTE — Progress Notes (Signed)
Second troponin elevated- started heparin drip Cardiology consult appreciated Possible cath tomorrow around lunch time. NPO after midnight

## 2017-03-24 NOTE — H&P (Addendum)
[facility]  [place] [facility]   PATIENT NAME: Glenn Hill    MR#:  [id number]  DATE OF BIRTH:  03/03/1946  DATE OF ADMISSION:  03/24/2017  PRIMARY CARE PHYSICIAN: Tommie Samsook, Jayce G, DO   REQUESTING/REFERRING PHYSICIAN: Dr. Sharyn CreamerMark Quale  CHIEF COMPLAINT:   Chief Complaint  Patient presents with  . Chest Pain    HISTORY OF PRESENT ILLNESS:  Glenn Hill  is a 72 y.o. male with a known history of abdominal aortic aneurysm, hypertension, hyperlipidemia and nephrolithiasis presents to the hospital secondary to crushing chest pain that started this morning. Patient states that he has on and off chest pains for a long time. Has stress test done with his PCP as outpatient, last one being last year which were normal. This morning he was going to work, was walking from his car and suddenly felt that the chest pain has worsened and within a minute he had crushing tightness in his chest. Denies any associated dyspnea or diaphoresis. No fevers or chills, no nausea or vomiting. No cough. No sick contacts. Chest pain improved with aspirin and nitroglycerin here in the emergency room. EKG showing T-wave inversion in lateral leads. First troponin is slightly elevated at 0.09. His blood pressure was noted to be significantly elevated here in the emergency room.  PAST MEDICAL HISTORY:   Past Medical History:  Diagnosis Date  . AAA (abdominal aortic aneurysm) (HCC)   . BPH (benign prostatic hyperplasia)   . Elevated PSA   . HLD (hyperlipidemia)   . HTN (hypertension)   . Kidney stones     PAST SURGICAL HISTORY:   Past Surgical History:  Procedure Laterality Date  . Leg circulation Surgery Left     SOCIAL HISTORY:   Social History   Tobacco Use  . Smoking status: Current Every Day Smoker    Packs/day: 0.50    Years: 55.00    Pack years: 27.50    Types: Cigarettes  . Smokeless tobacco: Never Used  Substance Use Topics  . Alcohol use: No    Alcohol/week:  0.0 oz    FAMILY HISTORY:   Family History  Problem Relation Age of Onset  . Dementia Father   . Heart attack Father   . Alzheimer's disease Mother   . CAD Brother     DRUG ALLERGIES:   Allergies  Allergen Reactions  . Sulfa Antibiotics     swelling    REVIEW OF SYSTEMS:   Review of Systems  Constitutional: Negative for chills, fever, malaise/fatigue and weight loss.  HENT: Negative for ear discharge, ear pain, hearing loss, nosebleeds and tinnitus.   Eyes: Negative for blurred vision, double vision and photophobia.  Respiratory: Negative for cough, hemoptysis, shortness of breath and wheezing.   Cardiovascular: Positive for chest pain. Negative for palpitations, orthopnea and leg swelling.  Gastrointestinal: Negative for abdominal pain, constipation, diarrhea, heartburn, melena, nausea and vomiting.  Genitourinary: Negative for dysuria, frequency, hematuria and urgency.  Musculoskeletal: Negative for back pain, myalgias and neck pain.  Skin: Negative for rash.  Neurological: Negative for dizziness, tingling, tremors, sensory change, speech change, focal weakness and headaches.  Endo/Heme/Allergies: Does not bruise/bleed easily.  Psychiatric/Behavioral: Negative for depression.    MEDICATIONS AT HOME:   Prior to Admission medications   Medication Sig Start Date End Date Taking? Authorizing Provider  amLODipine (NORVASC) 5 MG tablet Take 1 tablet (5 mg total) by mouth daily. 09/28/16   Tommie Samsook, Jayce G, DO  aspirin EC 81 MG tablet  Take 81 mg by mouth daily.    [provider]  baclofen (LIORESAL) 10 MG tablet TAKE ONE TABLET BY MOUTH TWICE DAILY AS NEEDED 02/05/16   [provider]  cholecalciferol (VITAMIN D) 1000 units tablet Take 1,000 Units by mouth daily.    [provider]  losartan (COZAAR) 50 MG tablet Take 50 mg by mouth daily.    [provider]  meloxicam (MOBIC) 15 MG tablet Mobic 15 mg tablet  Take 1 tablet every day by oral  route with meals.    [provider]  metoprolol succinate (TOPROL-XL) 50 MG 24 hr tablet Take 1 tablet (50 mg total) by mouth daily. Take with or immediately following a meal. 12/17/16   Glori Luis, MD  omeprazole (PRILOSEC) 20 MG capsule Take 20 mg by mouth daily.    [provider]  rosuvastatin (CRESTOR) 20 MG tablet Take 1 tablet (20 mg total) by mouth daily. 11/26/16   Tommie Sams, DO  tamsulosin (FLOMAX) 0.4 MG CAPS capsule Take 0.4 mg by mouth daily.    [provider]      VITAL SIGNS:  Blood pressure (!) 193/102, pulse 69, temperature 97.7 F (36.5 C), temperature source Oral, resp. rate 17, weight 84.4 kg (186 lb), SpO2 98 %.  PHYSICAL EXAMINATION:   Physical Exam  GENERAL:  73 y.o.-year-old patient lying in the bed with no acute distress.  EYES: Pupils equal, round, reactive to light and accommodation. No scleral icterus. Extraocular muscles intact.  HEENT: Head atraumatic, normocephalic. Oropharynx and nasopharynx clear.  NECK:  Supple, no jugular venous distention. No thyroid enlargement, no tenderness.  LUNGS: Normal breath sounds bilaterally, no wheezing, rales,rhonchi or crepitation. No use of accessory muscles of respiration.  CARDIOVASCULAR: S1, S2 normal. No murmurs, rubs, or gallops.  ABDOMEN: Soft, nontender, nondistended. Bowel sounds present. No organomegaly or mass.  EXTREMITIES: No pedal edema, cyanosis, or clubbing.  NEUROLOGIC: Cranial nerves II through XII are intact. Muscle strength 5/5 in all extremities. Sensation intact. Gait not checked.  PSYCHIATRIC: The patient is alert and oriented x 3.  SKIN: No obvious rash, lesion, or ulcer.   LABORATORY PANEL:   CBC Recent Labs  Lab 03/24/17 1040  WBC 6.1  HGB 13.6  HCT 39.1*  PLT 221   ------------------------------------------------------------------------------------------------------------------  Chemistries  Recent Labs  Lab 03/24/17 1040  NA 140  K 4.0  CL  109  CO2 22  GLUCOSE 119*  BUN 23*  CREATININE 1.10  CALCIUM 8.9   ------------------------------------------------------------------------------------------------------------------  Cardiac Enzymes Recent Labs  Lab 03/24/17 1040  TROPONINI 0.09*   ------------------------------------------------------------------------------------------------------------------  RADIOLOGY:  Ct Angio Chest Aorta W And/or Wo Contrast  Result Date: 03/24/2017 CLINICAL DATA:  72 year old male with a history of chest pain and hypertension. History of abdominal aortic aneurysm EXAM: CT ANGIOGRAPHY CHEST, ABDOMEN AND PELVIS TECHNIQUE: Multidetector CT imaging through the chest, abdomen and pelvis was performed using the standard protocol during bolus administration of intravenous contrast. Multiplanar reconstructed images and MIPs were obtained and reviewed to evaluate the vascular anatomy. CONTRAST:  ISOVUE-370 IOPAMIDOL (ISOVUE-370) INJECTION 76% COMPARISON:  08/02/2012, 09/12/2006, 06/06/2006 FINDINGS: CTA CHEST FINDINGS Cardiovascular: Heart: Cardiac diameter borderline enlarged. No pericardial fluid/ thickening. Coronary calcifications of the left anterior descending coronary artery. Minimal calcifications of the aortic valve. Aorta: Diameter of ascending thoracic aorta measures 4.4 cm. No dissection flap. Mild atherosclerotic changes of the aortic arch. Branch vessels are patent with 3 vessel aortic arch. Greatest diameter of the descending thoracic aorta  above the hiatus measures 4.3 cm at site of aortic ulcer, projecting toward the anterior right. The ulcer measures 1.4 cm at the base, with an estimated depth of 1.1 cm. Overall, there has not been significant change from the comparison CT of 08/02/2012. No associated inflammatory changes within the fat. No periaortic fluid. Aneurysm at the aortic hiatus, with the greatest diameter measuring 5.2 cm on the parasagittal images, measured perpendicular to the  flow channel. No inflammatory changes at this site. No periaortic fluid. The diameter of the aorta at the aortic hiatus appears relatively unchanged to the CT 08/02/2012, however, no parasagittal images are available for comparison on the prior study. Pulmonary arteries: No central, lobar, segmental, or proximal subsegmental filling defects. Mediastinum/Nodes: No mediastinal adenopathy. Small lymph nodes are present. Unremarkable thoracic inlet. Unremarkable appearance of the thoracic esophagus. Lungs/Pleura: Paraseptal and centrilobular emphysema. No confluent airspace disease. No pleural effusion. No pneumothorax. No endotracheal or endobronchial debris. No bronchial wall thickening. 4 mm nodule at the left lung apex adjacent to the fissure. No pneumothorax. Musculoskeletal: No acute displaced fracture. Degenerative changes of the thoracic spine. Review of the MIP images confirms the above findings. CTA ABDOMEN AND PELVIS FINDINGS VASCULAR Aorta: Below the aortic hiatus, were the diameter measures greatest, the diameter of the aorta is relatively unchanged from the comparison CT. No dissection flap. No periaortic fluid. Similar appearance of irregular plaque/ early ulceration projecting anterior and to the left at the level of the inferior mesenteric artery origin. Appearance is unchanged from the comparison. Diameter at the celiac artery measures 3.4 cm. Diameter at the superior mesenteric artery measures 3.0 cm. Diameter at the renal artery origins measures 2.4 cm. No inflammatory changes or periaortic fluid. Diameter of the distal aorta at the bifurcation measures 2.3 cm. Celiac: Minimal atherosclerotic changes at the origin of the celiac artery. SMA: Minimal atherosclerotic changes at the origin of the SMA. Renals: The bilateral main renal arteries are patent with no significant atherosclerotic changes. Small accessory left renal artery just anterior to the main left renal artery, as well as an additional  accessory renal artery cephalad to the main renal artery. IMA: Inferior mesenteric arteries patent. 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 No dissection. Hypogastric artery originates from the aneurysm and remains patent. External iliac artery patent with no significant stenosis or occlusion. Proximal SFA and profunda femoris are patent. Left lower extremity: No dissection or aneurysm of the left common iliac artery. There appears to be stenosis of the hypogastric artery origin secondary to calcified and soft plaque. Mild atherosclerotic changes of the left external iliac artery. Common femoral artery, proximal SFA and profunda femoris patent. Veins: Unremarkable appearance of the venous system. Review of the MIP images confirms the above findings. NON-VASCULAR Hepatobiliary: Hypodense lesion within the right liver, with only a single early arterial phase, in the region of the previously identified hemangioma. Lesion appears smaller on today's study measuring only approximately 2.5 cm. Unremarkable gallbladder. Pancreas: Unremarkable pancreas Spleen: Unremarkable spleen Adrenals/Urinary Tract: Unremarkable adrenal glands. Right: Nonobstructive stone at the superior collecting system of the right kidney measures 19 mm. No hydronephrosis. No perinephric stranding. Unremarkable course of the right ureter. Low-density lesion on the lateral cortex on image 110 compatible with a simple cyst. Small lesions at the posterior cortex are too small to characterize. Low-density lesion at the inferior cortex on image 123, compatible with simple cyst. Left: Nonobstructive stone at the  anterior collecting system of the left kidney Unremarkable course of the left ureter. No perinephric stranding. No left-sided hydronephrosis. Low-density cyst at the superior cortex on image 92, simple cyst. Additional smaller low-density lesions on the  anterior and lateral cortex are too small to characterize. Unremarkable appearance of the urinary bladder . Stomach/Bowel: Small hiatal hernia. Unremarkable stomach. Unremarkable small bowel with no abnormal distention. No focal wall thickening. No transition point or inflammatory changes. Normal appendix. Mild stool burden. No abnormal distention. No inflammatory changes. Mild colonic diverticular change. Lymphatic: No abdominal lymphadenopathy.  No inflammatory changes. Reproductive: Transverse diameter of the prostate measures 5.6 cm Other: Bilateral fat containing inguinal hernia. Fat containing umbilical hernia. Musculoskeletal: No acute fracture. Degenerative changes of the lumbar spine. Mild degenerative changes of the hips. IMPRESSION: No acute vascular abnormality. 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 Diameter of the aorta at the SMA origin measures 3.0 cm. Irregular ulcerated plaque of the mid descending thoracic aorta, unchanged from the comparison CT. Diameter of the aorta at this level measures approximately 4.2 cm, with depth of the ulcer approximately 1.1 cm. No associated inflammatory changes. Additional irregular/ulcerated plaque of the infrarenal abdominal aorta at the level of the IMA. Aortic Atherosclerosis (ICD10-I70.0). Paraseptal and centrilobular emphysema.  Emphysema (ICD10-J43.9). Small nodule at the left lung apex. Given the apparent risk factors, noncontrast chest CT can be considered in 12 months. This recommendation follows the consensus statement: Guidelines for Management of Incidental Pulmonary Nodules Detected on CT Images: From the Fleischner Society 2017; Radiology 2017; 284:228-243. Bilateral nonobstructing nephrolithiasis. Bilateral benign cysts of the kidneys. There are multiple bilateral kidney lesions  which are too small to characterize. Small hiatal hernia. Re- demonstration of right liver lobe lesion, which is been previously characterized as hemangioma. Diverticular disease without evidence of acute diverticulitis. Single vessel coronary artery disease. Electronically Signed   By: Gilmer Mor D.O.   On: 03/24/2017 12:26   Ct Angio Abd/pel W And/or Wo Contrast  Result Date: 03/24/2017 CLINICAL DATA:  72 year old male with a history of chest pain and hypertension. History of abdominal aortic aneurysm EXAM: CT ANGIOGRAPHY CHEST, ABDOMEN AND PELVIS TECHNIQUE: Multidetector CT imaging through the chest, abdomen and pelvis was performed using the standard protocol during bolus administration of intravenous contrast. Multiplanar reconstructed images and MIPs were obtained and reviewed to evaluate the vascular anatomy. CONTRAST:  ISOVUE-370 IOPAMIDOL (ISOVUE-370) INJECTION 76% COMPARISON:  08/02/2012, 09/12/2006, 06/06/2006 FINDINGS: CTA CHEST FINDINGS Cardiovascular: Heart: Cardiac diameter borderline enlarged. No pericardial fluid/ thickening. Coronary calcifications of the left anterior descending coronary artery. Minimal calcifications of the aortic valve. Aorta: Diameter of ascending thoracic aorta measures 4.4 cm. No dissection flap. Mild atherosclerotic changes of the aortic arch. Branch vessels are patent with 3 vessel aortic arch. Greatest diameter of the descending thoracic aorta above the hiatus measures 4.3 cm at site of aortic ulcer, projecting toward the anterior right. The ulcer measures 1.4 cm at the base, with an estimated depth of 1.1 cm. Overall, there has not been significant change from the comparison CT of 08/02/2012. No associated inflammatory changes within the fat. No periaortic fluid. 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 No inflammatory changes at this site. No periaortic  fluid. The diameter of the aorta at the aortic hiatus appears  relatively unchanged to the CT 08/02/2012, however, no parasagittal images are available for comparison on the prior study. Pulmonary arteries: No central, lobar, segmental, or proximal subsegmental filling defects. Mediastinum/Nodes: No mediastinal adenopathy. Small lymph nodes are present. Unremarkable thoracic inlet. Unremarkable appearance of the thoracic esophagus. Lungs/Pleura: Paraseptal and centrilobular emphysema. No confluent airspace disease. No pleural effusion. No pneumothorax. No endotracheal or endobronchial debris. No bronchial wall thickening. 4 mm nodule at the left lung apex adjacent to the fissure. No pneumothorax. Musculoskeletal: No acute displaced fracture. Degenerative changes of the thoracic spine. Review of the MIP images confirms the above findings. CTA ABDOMEN AND PELVIS FINDINGS VASCULAR Aorta: Below the aortic hiatus, were the diameter measures greatest, the diameter of the aorta is relatively unchanged from the comparison CT. No dissection flap. No periaortic fluid. Similar appearance of irregular plaque/ early ulceration projecting anterior and to the left at the level of the inferior mesenteric artery origin. Appearance is unchanged from the comparison. Diameter at the celiac artery measures 3.4 cm. Diameter at the superior mesenteric artery measures 3.0 cm. Diameter at the renal artery origins measures 2.4 cm. No inflammatory changes or periaortic fluid. Diameter of the distal aorta at the bifurcation measures 2.3 cm. Celiac: Minimal atherosclerotic changes at the origin of the celiac artery. SMA: Minimal atherosclerotic changes at the origin of the SMA. Renals: The bilateral main renal arteries are patent with no significant atherosclerotic changes. Small accessory left renal artery just anterior to the main left renal artery, as well as an additional accessory renal artery cephalad to the main renal artery. IMA:  Inferior mesenteric arteries patent. 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 No dissection. Hypogastric artery originates from the aneurysm and remains patent. External iliac artery patent with no significant stenosis or occlusion. Proximal SFA and profunda femoris are patent. Left lower extremity: No dissection or aneurysm of the left common iliac artery. There appears to be stenosis of the hypogastric artery origin secondary to calcified and soft plaque. Mild atherosclerotic changes of the left external iliac artery. Common femoral artery, proximal SFA and profunda femoris patent. Veins: Unremarkable appearance of the venous system. Review of the MIP images confirms the above findings. NON-VASCULAR Hepatobiliary: Hypodense lesion within the right liver, with only a single early arterial phase, in the region of the previously identified hemangioma. Lesion appears smaller on today's study measuring only approximately 2.5 cm. Unremarkable gallbladder. Pancreas: Unremarkable pancreas Spleen: Unremarkable spleen Adrenals/Urinary Tract: Unremarkable adrenal glands. Right: Nonobstructive stone at the superior collecting system of the right kidney measures 19 mm. No hydronephrosis. No perinephric stranding. Unremarkable course of the right ureter. Low-density lesion on the lateral cortex on image 110 compatible with a simple cyst. Small lesions at the posterior cortex are too small to characterize. Low-density lesion at the inferior cortex on image 123, compatible with simple cyst. Left: Nonobstructive stone at the anterior collecting system of the left kidney Unremarkable course of the left ureter. No perinephric stranding. No left-sided hydronephrosis. Low-density cyst at the superior cortex on image 92, simple cyst. Additional smaller low-density lesions on the anterior and lateral cortex are too small to characterize.  Unremarkable appearance of the urinary bladder . Stomach/Bowel: Small hiatal hernia. Unremarkable stomach. Unremarkable small bowel with no abnormal distention. No focal wall thickening. No transition point or inflammatory changes. Normal appendix. Mild stool burden. No abnormal distention. No inflammatory changes. Mild colonic diverticular change. Lymphatic: No abdominal lymphadenopathy.  No inflammatory changes. Reproductive: Transverse diameter of the prostate measures 5.6 cm Other: Bilateral fat containing inguinal hernia. Fat containing umbilical hernia. Musculoskeletal: No acute fracture. Degenerative changes of the lumbar spine. Mild degenerative changes of the hips. IMPRESSION: No acute vascular abnormality. 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 Diameter of the aorta at the SMA origin measures 3.0 cm. Irregular ulcerated plaque of the mid descending thoracic aorta, unchanged from the comparison CT. Diameter of the aorta at this level measures approximately 4.2 cm, with depth of the ulcer approximately 1.1 cm. No associated inflammatory changes. Additional irregular/ulcerated plaque of the infrarenal abdominal aorta at the level of the IMA. Aortic Atherosclerosis (ICD10-I70.0). Paraseptal and centrilobular emphysema.  Emphysema (ICD10-J43.9). Small nodule at the left lung apex. Given the apparent risk factors, noncontrast chest CT can be considered in 12 months. This recommendation follows the consensus statement: Guidelines for Management of Incidental Pulmonary Nodules Detected on CT Images: From the Fleischner Society 2017; Radiology 2017; 284:228-243. Bilateral nonobstructing nephrolithiasis. Bilateral benign cysts of the kidneys. There are multiple bilateral kidney lesions which are too small to characterize. Small hiatal hernia. Re-  demonstration of right liver lobe lesion, which is been previously characterized as hemangioma. Diverticular disease without evidence of acute diverticulitis. Single vessel coronary artery disease. Electronically Signed   By: Gilmer Mor D.O.   On: 03/24/2017 12:26    EKG:   Orders placed or performed during the hospital encounter of 03/24/17  . ED EKG  . ED EKG  . EKG 12-Lead  . EKG 12-Lead    IMPRESSION AND PLAN:   Glenn Hill  is a 72 y.o. male with a known history of abdominal aortic aneurysm, hypertension, hyperlipidemia and nephrolithiasis presents to the hospital secondary to crushing chest pain that started this morning.  1. Chest pain- unstable angina -Admit to telemetry, recycle troponins. -Chest pain is improved, hold off on heparin drip unless second troponin is elevated. -Echocardiogram ordered. Cardiology consult requested -Nitropaste -Continue aspirin, check lipid panel and statin.  2. 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 -Being followed with vascular as outpatient -CT angiogram with ulcerated aneurysm at 5 cm -We'll consult vascular   3. Hypertensive urgency-IV adults and when necessary -On Norvasc and metoprolol and losartan-we will continue.  4. Hyperlipidemia-on statin  5. DVT prophylaxis-on Lovenox    All the records are reviewed and case discussed with ED provider. Management plans discussed with the patient, family and they are in agreement.  CODE STATUS: Full code  TOTAL TIME TAKING CARE OF THIS PATIENT: 50 minutes.    Enid Baas M.D on 03/24/2017 at 2:10 PM  Between 7am to 6pm - Pager - [phone]  After 6pm go to [email or web address] - Social research officer, government  [facility]  Office  [phone]  CC: Primary care physician; Tommie Sams, DO

## 2017-03-24 NOTE — ED Notes (Signed)
Patient provided meal tray, MD approved

## 2017-03-24 NOTE — ED Notes (Signed)
Patient blood pressure remains elevated, MD made aware.

## 2017-03-24 NOTE — ED Triage Notes (Signed)
Pt to ed via ems from grocery store, started having chest pain while ambulating. Pt called 911, on arrival ems reports pt with high blood pressure 200 systolic, was 5/10pain, pt was given one inch of nitropaste with relief. Pt has not had his meds this am. Pt a/ox3 on arrival to ed,  bp improving. Pt with hx of AAA with repair and HTN.  edp at bedside.

## 2017-03-24 NOTE — ED Provider Notes (Signed)
[facility] Emergency Department Provider Note   ____________________________________________   First MD Initiated Contact with Patient 03/24/17 1037     (approximate)  I have reviewed the triage vital signs and the nursing notes.   HISTORY  Chief Complaint Chest Pain    HPI Glenn Hill is a 72 y.o. male history of abdominal aneurysm, hyperlipidemia hypertension and kidney stones  Patient reports that about 830 this morning he noticed some discomfort in his chest, he was walking and noticed that seem to get a lot worse felt like a heavy chest discomfort and pressure across the left chest.  He reports after receiving nitroglycerin with paramedics that the symptoms have improved and he is no longer experiencing chest pain.  EMS did notice T wave abnormality particularly in the lateral precordial region on their arrival, but no ST elevation.  Denies abdominal pain, but does report that the pain and discomfort felt like it strange feeling of pressure across the chest.  Denies discomfort in the lower legs.  No numbness or tingling.  No headache.  Reports he follows up with [facility] regarding his aneurysm  Past Medical History:  Diagnosis Date  . AAA (abdominal aortic aneurysm) (HCC)   . BPH (benign prostatic hyperplasia)   . Elevated PSA   . HLD (hyperlipidemia)   . HTN (hypertension)   . Kidney stones     Patient Active Problem List   Diagnosis Date Noted  . BPH (benign prostatic hyperplasia) 08/26/2016  . Tobacco abuse 08/26/2016  . Essential hypertension, benign 04/06/2016  . Hyperlipidemia 04/06/2016  . Thoracic aortic aneurysm (HCC) 04/06/2016  . 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 Leg circulation Surgery Left     Prior to Admission medications   Medication Sig Start Date End Date Taking? Authorizing Provider  amLODipine (NORVASC) 5 MG tablet Take 1 tablet (5  mg total) by mouth daily. 09/28/16   Tommie Sams, DO  aspirin EC 81 MG tablet Take 81 mg by mouth daily.    [provider]  baclofen (LIORESAL) 10 MG tablet TAKE ONE TABLET BY MOUTH TWICE DAILY AS NEEDED 02/05/16   [provider]  cholecalciferol (VITAMIN D) 1000 units tablet Take 1,000 Units by mouth daily.    [provider]  losartan (COZAAR) 50 MG tablet Take 50 mg by mouth daily.    [provider]  meloxicam (MOBIC) 15 MG tablet Mobic 15 mg tablet  Take 1 tablet every day by oral route with meals.    [provider]  metoprolol succinate (TOPROL-XL) 50 MG 24 hr tablet Take 1 tablet (50 mg total) by mouth daily. Take with or immediately following a meal. 12/17/16   Glori Luis, MD  omeprazole (PRILOSEC) 20 MG capsule Take 20 mg by mouth daily.    [provider]  rosuvastatin (CRESTOR) 20 MG tablet Take 1 tablet (20 mg total) by mouth daily. 11/26/16   Tommie Sams, DO  tamsulosin (FLOMAX) 0.4 MG CAPS capsule Take 0.4 mg by mouth daily.    [provider]    Allergies Sulfa antibiotics  Family History  Problem Relation Age of Onset  . Dementia Father   . Heart attack Father   . Alzheimer's disease Mother     Social History Social History   Tobacco Use  . Smoking status: Current Every Day Smoker    Packs/day: 0.50    Years: 55.00  Pack years: 27.50    Types: Cigarettes  . Smokeless tobacco: Never Used  Substance Use Topics  . Alcohol use: No    Alcohol/week: 0.0 oz  . Drug use: No    Review of Systems Constitutional: No fever/chills Eyes: No visual changes. ENT: No sore throat. Cardiovascular: see HPI respiratory: Denies shortness of breath. Gastrointestinal: No abdominal pain.  No nausea, no vomiting.  No diarrhea.  No constipation. Genitourinary: Negative for dysuria. Musculoskeletal: Negative for back pain. Skin: Negative for rash. Neurological: Negative for headaches, focal weakness or  numbness.    ____________________________________________   PHYSICAL EXAM:  VITAL SIGNS: ED Triage Vitals  Enc Vitals Group     BP 03/24/17 1038 (!) 182/91     Pulse Rate 03/24/17 1038 72     Resp 03/24/17 1038 20     Temp 03/24/17 1038 97.7 F (36.5 C)     Temp Source 03/24/17 1038 Oral     SpO2 03/24/17 1038 97 %     Weight 03/24/17 1038 186 lb (84.4 kg)     Height --      Head Circumference --      Peak Flow --      Pain Score 03/24/17 1037 1     Pain Loc --      Pain Edu? --      Excl. in GC? --     Constitutional: Alert and oriented. Well appearing and in no acute distress. Eyes: Conjunctivae are normal. Head: Atraumatic. Nose: No congestion/rhinnorhea. Mouth/Throat: Mucous membranes are moist. Neck: No stridor.   Cardiovascular: Normal rate, regular rhythm. Grossly normal heart sounds.  Good peripheral circulation. Respiratory: Normal respiratory effort.  No retractions. Lungs CTAB. Gastrointestinal: Soft and nontender. No distention. Musculoskeletal: No lower extremity tenderness nor edema. Neurologic:  Normal speech and language. No gross focal neurologic deficits are appreciated.  Skin:  Skin is warm, dry and intact. No rash noted. Psychiatric: Mood and affect are normal. Speech and behavior are normal.  ____________________________________________   LABS (all labs ordered are listed, but only abnormal results are displayed)  Labs Reviewed  CBC - Abnormal; Notable for the following components:      Result Value   HCT 39.1 (*)    RDW 15.7 (*)    All other components within normal limits  BASIC METABOLIC PANEL - Abnormal; Notable for the following components:   Glucose, Bld 119 (*)    BUN 23 (*)    All other components within normal limits  TROPONIN I - Abnormal; Notable for the following components:   Troponin I 0.09 (*)    All other components within normal limits  APTT - Abnormal; Notable for the following components:   aPTT 49 (*)    All other  components within normal limits  PROTIME-INR   ____________________________________________  EKG  Reviewed by me, and interpreted Normal sinus rhythm, ventricular rate 75 T wave inversions notably in lead III and aVF, also noted in V4 through V6, no evidence of acute ST elevation MI. QRS 130 QTC 460  ____________________________________________  RADIOLOGY  Ct Angio Chest Aorta W And/or Wo Contrast  Result Date: 03/24/2017 CLINICAL DATA:  72 year old male with a history of chest pain and hypertension. History of abdominal aortic aneurysm EXAM: CT ANGIOGRAPHY CHEST, ABDOMEN AND PELVIS TECHNIQUE: Multidetector CT imaging through the chest, abdomen and pelvis was performed using the standard protocol during bolus administration of intravenous contrast. Multiplanar reconstructed images and MIPs were obtained and reviewed to evaluate the vascular anatomy.  CONTRAST:  ISOVUE-370 IOPAMIDOL (ISOVUE-370) INJECTION 76% COMPARISON:  08/02/2012, 09/12/2006, 06/06/2006 FINDINGS: CTA CHEST FINDINGS Cardiovascular: Heart: Cardiac diameter borderline enlarged. No pericardial fluid/ thickening. Coronary calcifications of the left anterior descending coronary artery. Minimal calcifications of the aortic valve. Aorta: Diameter of ascending thoracic aorta measures 4.4 cm. No dissection flap. Mild atherosclerotic changes of the aortic arch. Branch vessels are patent with 3 vessel aortic arch. Greatest diameter of the descending thoracic aorta above the hiatus measures 4.3 cm at site of aortic ulcer, projecting toward the anterior right. The ulcer measures 1.4 cm at the base, with an estimated depth of 1.1 cm. Overall, there has not been significant change from the comparison CT of 08/02/2012. No associated inflammatory changes within the fat. No periaortic fluid. 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 No  inflammatory changes at this site. No periaortic fluid. The diameter of the aorta at the aortic hiatus appears relatively unchanged to the CT 08/02/2012, however, no parasagittal images are available for comparison on the prior study. Pulmonary arteries: No central, lobar, segmental, or proximal subsegmental filling defects. Mediastinum/Nodes: No mediastinal adenopathy. Small lymph nodes are present. Unremarkable thoracic inlet. Unremarkable appearance of the thoracic esophagus. Lungs/Pleura: Paraseptal and centrilobular emphysema. No confluent airspace disease. No pleural effusion. No pneumothorax. No endotracheal or endobronchial debris. No bronchial wall thickening. 4 mm nodule at the left lung apex adjacent to the fissure. No pneumothorax. Musculoskeletal: No acute displaced fracture. Degenerative changes of the thoracic spine. Review of the MIP images confirms the above findings. CTA ABDOMEN AND PELVIS FINDINGS VASCULAR Aorta: Below the aortic hiatus, were the diameter measures greatest, the diameter of the aorta is relatively unchanged from the comparison CT. No dissection flap. No periaortic fluid. Similar appearance of irregular plaque/ early ulceration projecting anterior and to the left at the level of the inferior mesenteric artery origin. Appearance is unchanged from the comparison. Diameter at the celiac artery measures 3.4 cm. Diameter at the superior mesenteric artery measures 3.0 cm. Diameter at the renal artery origins measures 2.4 cm. No inflammatory changes or periaortic fluid. Diameter of the distal aorta at the bifurcation measures 2.3 cm. Celiac: Minimal atherosclerotic changes at the origin of the celiac artery. SMA: Minimal atherosclerotic changes at the origin of the SMA. Renals: The bilateral main renal arteries are patent with no significant atherosclerotic changes. Small accessory left renal artery just anterior to the main left renal artery, as well as an additional accessory renal  artery cephalad to the main renal artery. IMA: Inferior mesenteric arteries patent. 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 No dissection. Hypogastric artery originates from the aneurysm and remains patent. External iliac artery patent with no significant stenosis or occlusion. Proximal SFA and profunda femoris are patent. Left lower extremity: No dissection or aneurysm of the left common iliac artery. There appears to be stenosis of the hypogastric artery origin secondary to calcified and soft plaque. Mild atherosclerotic changes of the left external iliac artery. Common femoral artery, proximal SFA and profunda femoris patent. Veins: Unremarkable appearance of the venous system. Review of the MIP images confirms the above findings. NON-VASCULAR Hepatobiliary: Hypodense lesion within the right liver, with only a single early arterial phase, in the region of the previously identified hemangioma. Lesion appears smaller on today's study measuring only approximately 2.5 cm. Unremarkable gallbladder. Pancreas: Unremarkable pancreas  Spleen: Unremarkable spleen Adrenals/Urinary Tract: Unremarkable adrenal glands. Right: Nonobstructive stone at the superior collecting system of the right kidney measures 19 mm. No hydronephrosis. No perinephric stranding. Unremarkable course of the right ureter. Low-density lesion on the lateral cortex on image 110 compatible with a simple cyst. Small lesions at the posterior cortex are too small to characterize. Low-density lesion at the inferior cortex on image 123, compatible with simple cyst. Left: Nonobstructive stone at the anterior collecting system of the left kidney Unremarkable course of the left ureter. No perinephric stranding. No left-sided hydronephrosis. Low-density cyst at the superior cortex on image 92, simple cyst. Additional smaller low-density lesions on the anterior and lateral  cortex are too small to characterize. Unremarkable appearance of the urinary bladder . Stomach/Bowel: Small hiatal hernia. Unremarkable stomach. Unremarkable small bowel with no abnormal distention. No focal wall thickening. No transition point or inflammatory changes. Normal appendix. Mild stool burden. No abnormal distention. No inflammatory changes. Mild colonic diverticular change. Lymphatic: No abdominal lymphadenopathy.  No inflammatory changes. Reproductive: Transverse diameter of the prostate measures 5.6 cm Other: Bilateral fat containing inguinal hernia. Fat containing umbilical hernia. Musculoskeletal: No acute fracture. Degenerative changes of the lumbar spine. Mild degenerative changes of the hips. IMPRESSION: No acute vascular abnormality. 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 Diameter of the aorta at the SMA origin measures 3.0 cm. Irregular ulcerated plaque of the mid descending thoracic aorta, unchanged from the comparison CT. Diameter of the aorta at this level measures approximately 4.2 cm, with depth of the ulcer approximately 1.1 cm. No associated inflammatory changes. Additional irregular/ulcerated plaque of the infrarenal abdominal aorta at the level of the IMA. Aortic Atherosclerosis (ICD10-I70.0). Paraseptal and centrilobular emphysema.  Emphysema (ICD10-J43.9). Small nodule at the left lung apex. Given the apparent risk factors, noncontrast chest CT can be considered in 12 months. This recommendation follows the consensus statement: Guidelines for Management of Incidental Pulmonary Nodules Detected on CT Images: From the Fleischner Society 2017; Radiology 2017; 284:228-243. Bilateral nonobstructing nephrolithiasis. Bilateral benign cysts of the kidneys. There are multiple bilateral kidney lesions which are too small to  characterize. Small hiatal hernia. Re- demonstration of right liver lobe lesion, which is been previously characterized as hemangioma. Diverticular disease without evidence of acute diverticulitis. Single vessel coronary artery disease. Electronically Signed   By: Gilmer MorJaime  Wagner D.O.   On: 03/24/2017 12:26   Ct Angio Abd/pel W And/or Wo Contrast  Result Date: 03/24/2017 CLINICAL DATA:  72 year old male with a history of chest pain and hypertension. History of abdominal aortic aneurysm EXAM: CT ANGIOGRAPHY CHEST, ABDOMEN AND PELVIS TECHNIQUE: Multidetector CT imaging through the chest, abdomen and pelvis was performed using the standard protocol during bolus administration of intravenous contrast. Multiplanar reconstructed images and MIPs were obtained and reviewed to evaluate the vascular anatomy. CONTRAST:  100mL ISOVUE-370 IOPAMIDOL (ISOVUE-370) INJECTION 76% COMPARISON:  08/02/2012, 09/12/2006, 06/06/2006 FINDINGS: CTA CHEST FINDINGS Cardiovascular: Heart: Cardiac diameter borderline enlarged. No pericardial fluid/ thickening. Coronary calcifications of the left anterior descending coronary artery. Minimal calcifications of the aortic valve. Aorta: Diameter of ascending thoracic aorta measures 4.4 cm. No dissection flap. Mild atherosclerotic changes of the aortic arch. Branch vessels are patent with 3 vessel aortic arch. Greatest diameter of the descending thoracic aorta above the hiatus measures 4.3 cm at site of aortic ulcer, projecting toward the anterior right. The ulcer measures  1.4 cm at the base, with an estimated depth of 1.1 cm. Overall, there has not been significant change from the comparison CT of 08/02/2012. No associated inflammatory changes within the fat. No periaortic fluid. 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 No inflammatory changes at this site. No periaortic fluid. The diameter of  the aorta at the aortic hiatus appears relatively unchanged to the CT 08/02/2012, however, no parasagittal images are available for comparison on the prior study. Pulmonary arteries: No central, lobar, segmental, or proximal subsegmental filling defects. Mediastinum/Nodes: No mediastinal adenopathy. Small lymph nodes are present. Unremarkable thoracic inlet. Unremarkable appearance of the thoracic esophagus. Lungs/Pleura: Paraseptal and centrilobular emphysema. No confluent airspace disease. No pleural effusion. No pneumothorax. No endotracheal or endobronchial debris. No bronchial wall thickening. 4 mm nodule at the left lung apex adjacent to the fissure. No pneumothorax. Musculoskeletal: No acute displaced fracture. Degenerative changes of the thoracic spine. Review of the MIP images confirms the above findings. CTA ABDOMEN AND PELVIS FINDINGS VASCULAR Aorta: Below the aortic hiatus, were the diameter measures greatest, the diameter of the aorta is relatively unchanged from the comparison CT. No dissection flap. No periaortic fluid. Similar appearance of irregular plaque/ early ulceration projecting anterior and to the left at the level of the inferior mesenteric artery origin. Appearance is unchanged from the comparison. Diameter at the celiac artery measures 3.4 cm. Diameter at the superior mesenteric artery measures 3.0 cm. Diameter at the renal artery origins measures 2.4 cm. No inflammatory changes or periaortic fluid. Diameter of the distal aorta at the bifurcation measures 2.3 cm. Celiac: Minimal atherosclerotic changes at the origin of the celiac artery. SMA: Minimal atherosclerotic changes at the origin of the SMA. Renals: The bilateral main renal arteries are patent with no significant atherosclerotic changes. Small accessory left renal artery just anterior to the main left renal artery, as well as an additional accessory renal artery cephalad to the main renal artery. IMA: Inferior mesenteric arteries  patent. 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 No dissection. Hypogastric artery originates from the aneurysm and remains patent. External iliac artery patent with no significant stenosis or occlusion. Proximal SFA and profunda femoris are patent. Left lower extremity: No dissection or aneurysm of the left common iliac artery. There appears to be stenosis of the hypogastric artery origin secondary to calcified and soft plaque. Mild atherosclerotic changes of the left external iliac artery. Common femoral artery, proximal SFA and profunda femoris patent. Veins: Unremarkable appearance of the venous system. Review of the MIP images confirms the above findings. NON-VASCULAR Hepatobiliary: Hypodense lesion within the right liver, with only a single early arterial phase, in the region of the previously identified hemangioma. Lesion appears smaller on today's study measuring only approximately 2.5 cm. Unremarkable gallbladder. Pancreas: Unremarkable pancreas Spleen: Unremarkable spleen Adrenals/Urinary Tract: Unremarkable adrenal glands. Right: Nonobstructive stone at the superior collecting system of the right kidney measures 19 mm. No hydronephrosis. No perinephric stranding. Unremarkable course of the right ureter. Low-density lesion on the lateral cortex on image 110 compatible with a simple cyst. Small lesions at the posterior cortex are too small to characterize. Low-density lesion at the inferior cortex on image 123, compatible with simple cyst. Left: Nonobstructive stone at the anterior collecting system of the left kidney Unremarkable course of the left ureter. No perinephric stranding. No left-sided hydronephrosis.  Low-density cyst at the superior cortex on image 92, simple cyst. Additional smaller low-density lesions on the anterior and lateral cortex are too small to characterize. Unremarkable appearance of the urinary  bladder . Stomach/Bowel: Small hiatal hernia. Unremarkable stomach. Unremarkable small bowel with no abnormal distention. No focal wall thickening. No transition point or inflammatory changes. Normal appendix. Mild stool burden. No abnormal distention. No inflammatory changes. Mild colonic diverticular change. Lymphatic: No abdominal lymphadenopathy.  No inflammatory changes. Reproductive: Transverse diameter of the prostate measures 5.6 cm Other: Bilateral fat containing inguinal hernia. Fat containing umbilical hernia. Musculoskeletal: No acute fracture. Degenerative changes of the lumbar spine. Mild degenerative changes of the hips. IMPRESSION: No acute vascular abnormality. 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 Diameter of the aorta at the SMA origin measures 3.0 cm. Irregular ulcerated plaque of the mid descending thoracic aorta, unchanged from the comparison CT. Diameter of the aorta at this level measures approximately 4.2 cm, with depth of the ulcer approximately 1.1 cm. No associated inflammatory changes. Additional irregular/ulcerated plaque of the infrarenal abdominal aorta at the level of the IMA. Aortic Atherosclerosis (ICD10-I70.0). Paraseptal and centrilobular emphysema.  Emphysema (ICD10-J43.9). Small nodule at the left lung apex. Given the apparent risk factors, noncontrast chest CT can be considered in 12 months. This recommendation follows the consensus statement: Guidelines for Management of Incidental Pulmonary Nodules Detected on CT Images: From the Fleischner Society 2017; Radiology 2017; 284:228-243. Bilateral nonobstructing nephrolithiasis. Bilateral benign cysts of the kidneys. There are multiple bilateral kidney lesions which are too small to characterize. Small hiatal hernia. Re- demonstration of right liver lobe  lesion, which is been previously characterized as hemangioma. Diverticular disease without evidence of acute diverticulitis. Single vessel coronary artery disease. Electronically Signed   By: Gilmer Mor D.O.   On: 03/24/2017 12:26    CT reviewed, multiple findings, but the most notable is that the vasculature structure appears stable without acute abnormality ____________________________________________   PROCEDURES  Procedure(s) performed: None  Procedures  Critical Care performed: No  ____________________________________________   INITIAL IMPRESSION / ASSESSMENT AND PLAN / ED COURSE  Pertinent labs & imaging results that were available during my care of the patient were reviewed by me and considered in my medical decision making (see chart for details).    Patient presents for evaluation of chest pain.  Concerning for multiple risk factors for acute coronary syndrome including elevated troponin, EKG abnormality, and symptomatology.  However, in addition the patient does have a history of aortic aneurysm and thoracic aneurysm, and given his history with presentation of initial systolic blood pressure greater than 200 on EMS arrival I have ordered CT scans to evaluate further his aneurysm for signs of worsening or complication that may have led to his chest discomfort as well.  Reassuring now that his chest pain has improved, is currently asymptomatic his blood pressure has improved down to 164/92 at this time.  Fully anticipate admission for the patient, but wish to assure that the thoracic and aortic aneurysms appears stable on CT prior to disposition.  given ASA  Clinical Course as of Mar 24 1299  Thu Mar 24, 2017  1221 Awaiting CT results. Troponin I: (!!) 0.09 [MQ]  1238 Patient remains pain symptom free paged Dr. Cassie Freer  [MQ]    Clinical Course User Index [MQ] Sharyn Creamer, MD    ----------------------------------------- 1:01  PM on  03/24/2017 -----------------------------------------  Admitting patient to hospitalist service for further care and evaluation of chest pain. ____________________________________________   FINAL CLINICAL IMPRESSION(S) / ED DIAGNOSES  Final diagnoses:  Chest pain with high risk for cardiac etiology  Lung nodule      NEW MEDICATIONS STARTED DURING THIS VISIT:  This SmartLink is deprecated. Use AVSMEDLIST instead to display the medication list for a patient.   Note:  This document was prepared using Dragon voice recognition software and may include unintentional dictation errors.     Sharyn Creamer, MD 03/24/17 1301

## 2017-03-24 NOTE — ED Notes (Signed)
Pt back to room after CT. Pt up to bedside toilet at this time. NAD noted.

## 2017-03-24 NOTE — Progress Notes (Signed)
ANTICOAGULATION CONSULT NOTE - Initial Consult  Pharmacy Consult for heparin gtt Indication: chest pain/ACS  Allergies  Allergen Reactions  . Sulfa Antibiotics     swelling    Patient Measurements: Height: 5\' 8"  (172.7 cm) Weight: 190 lb 8 oz (86.4 kg) IBW/kg (Calculated) : 68.4 Heparin Dosing Weight: 85.8kg  Vital Signs: Temp: 97.5 F (36.4 C) (01/03 1536) Temp Source: Oral (01/03 1536) BP: 150/78 (01/03 1805) Pulse Rate: 71 (01/03 1805)  Labs: Recent Labs    03/24/17 1040 03/24/17 1540  HGB 13.6  --   HCT 39.1*  --   PLT 221  --   APTT 49*  --   LABPROT 13.8  --   INR 1.07  --   CREATININE 1.10  --   TROPONINI 0.09* 0.34*    Estimated Creatinine Clearance: 67.8 mL/min (by C-G formula based on SCr of 1.1 mg/dL).   Medical History: Past Medical History:  Diagnosis Date  . AAA (abdominal aortic aneurysm) (HCC)   . BPH (benign prostatic hyperplasia)   . Elevated PSA   . HLD (hyperlipidemia)   . HTN (hypertension)   . Kidney stones     Medications:  Medications Prior to Admission  Medication Sig Dispense Refill Last Dose  . amLODipine (NORVASC) 5 MG tablet Take 1 tablet (5 mg total) by mouth daily. 90 tablet 1 03/23/2017 at Unknown time  . aspirin EC 81 MG tablet Take 81 mg by mouth daily.   Past Month at Unknown time  . baclofen (LIORESAL) 10 MG tablet take 1 tablet by mouth daily   03/23/2017 at Unknown time  . buPROPion (WELLBUTRIN SR) 150 MG 12 hr tablet Take 150 mg by mouth daily.   03/23/2017 at Unknown time  . meloxicam (MOBIC) 15 MG tablet Mobic 15 mg tablet  Take 1 tablet every day by oral route with meals.   03/23/2017 at Unknown time  . metoprolol succinate (TOPROL-XL) 50 MG 24 hr tablet Take 1 tablet (50 mg total) by mouth daily. Take with or immediately following a meal. 90 tablet 3 03/23/2017 at Unknown time  . rosuvastatin (CRESTOR) 20 MG tablet Take 1 tablet (20 mg total) by mouth daily. 90 tablet 3 03/23/2017 at Unknown time  . tamsulosin (FLOMAX) 0.4  MG CAPS capsule Take 0.4 mg by mouth daily.   03/23/2017 at Unknown time   Scheduled:  . amLODipine  5 mg Oral Daily  . [START ON 03/25/2017] aspirin EC  81 mg Oral Daily  . cholecalciferol  1,000 Units Oral Daily  . heparin  4,000 Units Intravenous Once  . losartan  50 mg Oral Daily  . meloxicam  15 mg Oral Daily  . metoprolol succinate  50 mg Oral Daily  . nitroGLYCERIN  0.5 inch Topical Q6H  . pantoprazole  40 mg Oral Daily  . rosuvastatin  20 mg Oral Daily  . tamsulosin  0.4 mg Oral Daily   Infusions:  . heparin     PRN: acetaminophen **OR** acetaminophen, hydrALAZINE, ondansetron **OR** ondansetron (ZOFRAN) IV Anti-infectives (From admission, onward)   None      Assessment: 72 year old male requiring acs heparin gtt management per pharmacy   Goal of Therapy:  Heparin level 0.3-0.7 units/ml Monitor platelets by anticoagulation protocol: Yes   Plan:  Give 4000 units bolus x 1 Start heparin infusion at 1000 units/hr Check anti-Xa level in 6 hours and daily while on heparin Continue to monitor H&H and platelets  Gerre PebblesGarrett Omran Keelin 03/24/2017,6:17 PM

## 2017-03-24 NOTE — Consult Note (Signed)
[facility] Cardiology  CARDIOLOGY CONSULT NOTE  Patient ID: Glenn Hill MRN: [id number] DOB/AGE: 1945-11-20 72 y.o.  Admit date: 03/24/2017 Referring Physician Nemiah Commander Primary Physician [facility] Primary Cardiologist None per patient Reason for Consultation Chest pain, elevated troponin  HPI: 72 year old patient referred for evaluation of chest pain and elevated troponin.  The patient has a history of essential hypertension, hyperlipidemia, and AAA.  He denies a history of coronary artery disease or prior cardiac catheterization.  The patient reports a one-month history of intermittent episodes of chest pain occurring with and without exertion.  The patient reports experiencing chest pain yesterday while walking through the grocery store that progressed to crushing centralized chest pain without associated shortness of breath, diaphoresis, radiation, or nausea.  The patient checked out, drove home, and laid down for about 10 minutes without resolution of chest pain and called EMS.  The patient's chest pain resolved after nitroglycerin and he has had no recurrence.  Initial ECG revealed sinus rhythm with nonspecific T wave abnormalities without ST elevation.  Initial troponin was borderline elevated to 0.09.  The patient's blood pressure was noted to be significantly elevated.  Currently, the patient denies chest pain or shortness of breath, but reports a recent dull headache.  Review of systems complete and found to be negative unless listed above     Past Medical History:  Diagnosis Date  . AAA (abdominal aortic aneurysm) (HCC)   . BPH (benign prostatic hyperplasia)   . Elevated PSA   . HLD (hyperlipidemia)   . HTN (hypertension)   . Kidney stones     Past Surgical History:  Procedure Laterality Date  . Leg circulation Surgery Left     Medications Prior to Admission  Medication Sig Dispense Refill Last Dose  . amLODipine (NORVASC) 5 MG tablet Take 1 tablet (5 mg total) by mouth daily.  90 tablet 1 03/23/2017 at Unknown time  . aspirin EC 81 MG tablet Take 81 mg by mouth daily.   Past Month at Unknown time  . baclofen (LIORESAL) 10 MG tablet take 1 tablet by mouth daily   03/23/2017 at Unknown time  . buPROPion (WELLBUTRIN SR) 150 MG 12 hr tablet Take 150 mg by mouth daily.   03/23/2017 at Unknown time  . meloxicam (MOBIC) 15 MG tablet Mobic 15 mg tablet  Take 1 tablet every day by oral route with meals.   03/23/2017 at Unknown time  . metoprolol succinate (TOPROL-XL) 50 MG 24 hr tablet Take 1 tablet (50 mg total) by mouth daily. Take with or immediately following a meal. 90 tablet 3 03/23/2017 at Unknown time  . rosuvastatin (CRESTOR) 20 MG tablet Take 1 tablet (20 mg total) by mouth daily. 90 tablet 3 03/23/2017 at Unknown time  . tamsulosin (FLOMAX) 0.4 MG CAPS capsule Take 0.4 mg by mouth daily.   03/23/2017 at Unknown time   Social History   Socioeconomic History  . Marital status: Married    Spouse name: Not on file  . Number of children: Not on file  . Years of education: Not on file  . Highest education level: Not on file  Social Needs  . Financial resource strain: Not on file  . Food insecurity - worry: Not on file  . Food insecurity - inability: Not on file  . Transportation needs - medical: Not on file  . Transportation needs - non-medical: Not on file  Occupational History  . Not on file  Tobacco Use  . Smoking status: Current Every Day  Smoker    Packs/day: 0.50    Years: 55.00    Pack years: 27.50    Types: Cigarettes  . Smokeless tobacco: Never Used  Substance and Sexual Activity  . Alcohol use: No    Alcohol/week: 0.0 oz  . Drug use: No  . Sexual activity: No  Other Topics Concern  . Not on file  Social History Narrative   Independent at baseline. Works in [employer] sales    Family History  Problem Relation Age of Onset  . Dementia Father   . Heart attack Father   . Alzheimer's disease Mother   . CAD Brother       Review of systems complete and  found to be negative unless listed above      PHYSICAL EXAM  General: Well developed, well nourished, in no acute distress HEENT:  Normocephalic and atramatic Neck:  No JVD.  Lungs: Clear bilaterally to auscultation, normal effort of breathing on room air. Heart: HRRR . Normal S1 and S2 without gallops or murmurs.  Abdomen: Bowel sounds are positive, abdomen soft Msk:  Back normal, lying in bed, able to turn to side without difficulty. Normal strength and tone for age. Extremities: No clubbing, cyanosis or edema.   Neuro: Alert and oriented X 3. Psych:  Good affect, responds appropriately  Labs:   Lab Results  Component Value Date   WBC 6.1 03/24/2017   HGB 13.6 03/24/2017   HCT 39.1 (L) 03/24/2017   MCV 86.2 03/24/2017   PLT 221 03/24/2017    Recent Labs  Lab 03/24/17 1040  NA 140  K 4.0  CL 109  CO2 22  BUN 23*  CREATININE 1.10  CALCIUM 8.9  GLUCOSE 119*   Lab Results  Component Value Date   CKTOTAL 35 04/23/2012   CKMB 0.8 04/23/2012   TROPONINI 0.09 (HH) 03/24/2017    Lab Results  Component Value Date   CHOL 162 03/24/2017   CHOL 182 11/25/2016   CHOL 181 04/23/2012   Lab Results  Component Value Date   HDL 33 (L) 03/24/2017   HDL 30.20 (L) 11/25/2016   HDL 25 (L) 04/23/2012   Lab Results  Component Value Date   LDLCALC 71 03/24/2017   LDLCALC 121 (H) 04/23/2012   Lab Results  Component Value Date   TRIG 289 (H) 03/24/2017   TRIG 337.0 (H) 11/25/2016   TRIG 175 04/23/2012   Lab Results  Component Value Date   CHOLHDL 4.9 03/24/2017   CHOLHDL 6 11/25/2016   Lab Results  Component Value Date   LDLDIRECT 116.0 11/25/2016      Radiology: Ct Angio Chest Aorta W And/or Wo Contrast  Result Date: 03/24/2017 CLINICAL DATA:  72 year old male with a history of chest pain and hypertension. History of abdominal aortic aneurysm EXAM: CT ANGIOGRAPHY CHEST, ABDOMEN AND PELVIS TECHNIQUE: Multidetector CT imaging through the chest, abdomen and pelvis  was performed using the standard protocol during bolus administration of intravenous contrast. Multiplanar reconstructed images and MIPs were obtained and reviewed to evaluate the vascular anatomy. CONTRAST:  100mL ISOVUE-370 IOPAMIDOL (ISOVUE-370) INJECTION 76% COMPARISON:  08/02/2012, 09/12/2006, 06/06/2006 FINDINGS: CTA CHEST FINDINGS Cardiovascular: Heart: Cardiac diameter borderline enlarged. No pericardial fluid/ thickening. Coronary calcifications of the left anterior descending coronary artery. Minimal calcifications of the aortic valve. Aorta: Diameter of ascending thoracic aorta measures 4.4 cm. No dissection flap. Mild atherosclerotic changes of the aortic arch. Branch vessels are patent with 3 vessel aortic arch. Greatest diameter of the descending thoracic  aorta above the hiatus measures 4.3 cm at site of aortic ulcer, projecting toward the anterior right. The ulcer measures 1.4 cm at the base, with an estimated depth of 1.1 cm. Overall, there has not been significant change from the comparison CT of 08/02/2012. No associated inflammatory changes within the fat. No periaortic fluid. 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 No inflammatory changes at this site. No periaortic fluid. The diameter of the aorta at the aortic hiatus appears relatively unchanged to the CT 08/02/2012, however, no parasagittal images are available for comparison on the prior study. Pulmonary arteries: No central, lobar, segmental, or proximal subsegmental filling defects. Mediastinum/Nodes: No mediastinal adenopathy. Small lymph nodes are present. Unremarkable thoracic inlet. Unremarkable appearance of the thoracic esophagus. Lungs/Pleura: Paraseptal and centrilobular emphysema. No confluent airspace disease. No pleural effusion. No pneumothorax. No endotracheal or endobronchial debris. No bronchial wall thickening. 4 mm  nodule at the left lung apex adjacent to the fissure. No pneumothorax. Musculoskeletal: No acute displaced fracture. Degenerative changes of the thoracic spine. Review of the MIP images confirms the above findings. CTA ABDOMEN AND PELVIS FINDINGS VASCULAR Aorta: Below the aortic hiatus, were the diameter measures greatest, the diameter of the aorta is relatively unchanged from the comparison CT. No dissection flap. No periaortic fluid. Similar appearance of irregular plaque/ early ulceration projecting anterior and to the left at the level of the inferior mesenteric artery origin. Appearance is unchanged from the comparison. Diameter at the celiac artery measures 3.4 cm. Diameter at the superior mesenteric artery measures 3.0 cm. Diameter at the renal artery origins measures 2.4 cm. No inflammatory changes or periaortic fluid. Diameter of the distal aorta at the bifurcation measures 2.3 cm. Celiac: Minimal atherosclerotic changes at the origin of the celiac artery. SMA: Minimal atherosclerotic changes at the origin of the SMA. Renals: The bilateral main renal arteries are patent with no significant atherosclerotic changes. Small accessory left renal artery just anterior to the main left renal artery, as well as an additional accessory renal artery cephalad to the main renal artery. IMA: Inferior mesenteric arteries patent. 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 No dissection. Hypogastric artery originates from the aneurysm and remains patent. External iliac artery patent with no significant stenosis or occlusion. Proximal SFA and profunda femoris are patent. Left lower extremity: No dissection or aneurysm of the left common iliac artery. There appears to be stenosis of the hypogastric artery origin secondary to calcified and soft plaque. Mild atherosclerotic changes of the left external iliac artery. Common femoral artery,  proximal SFA and profunda femoris patent. Veins: Unremarkable appearance of the venous system. Review of the MIP images confirms the above findings. NON-VASCULAR Hepatobiliary: Hypodense lesion within the right liver, with only a single early arterial phase, in the region of the previously identified hemangioma. Lesion appears smaller on today's study measuring only approximately 2.5 cm. Unremarkable gallbladder. Pancreas: Unremarkable pancreas Spleen: Unremarkable spleen Adrenals/Urinary Tract: Unremarkable adrenal glands. Right: Nonobstructive stone at the superior collecting system of the right kidney measures 19 mm. No hydronephrosis. No perinephric stranding. Unremarkable course of the right ureter. Low-density lesion on the lateral cortex on image 110 compatible with a simple cyst. Small lesions at the posterior cortex are too small to characterize. Low-density lesion at the inferior cortex on image 123, compatible with simple cyst. Left: Nonobstructive stone at  the anterior collecting system of the left kidney Unremarkable course of the left ureter. No perinephric stranding. No left-sided hydronephrosis. Low-density cyst at the superior cortex on image 92, simple cyst. Additional smaller low-density lesions on the anterior and lateral cortex are too small to characterize. Unremarkable appearance of the urinary bladder . Stomach/Bowel: Small hiatal hernia. Unremarkable stomach. Unremarkable small bowel with no abnormal distention. No focal wall thickening. No transition point or inflammatory changes. Normal appendix. Mild stool burden. No abnormal distention. No inflammatory changes. Mild colonic diverticular change. Lymphatic: No abdominal lymphadenopathy.  No inflammatory changes. Reproductive: Transverse diameter of the prostate measures 5.6 cm Other: Bilateral fat containing inguinal hernia. Fat containing umbilical hernia. Musculoskeletal: No acute fracture. Degenerative changes of the lumbar spine. Mild  degenerative changes of the hips. IMPRESSION: No acute vascular abnormality. 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 Diameter of the aorta at the SMA origin measures 3.0 cm. Irregular ulcerated plaque of the mid descending thoracic aorta, unchanged from the comparison CT. Diameter of the aorta at this level measures approximately 4.2 cm, with depth of the ulcer approximately 1.1 cm. No associated inflammatory changes. Additional irregular/ulcerated plaque of the infrarenal abdominal aorta at the level of the IMA. Aortic Atherosclerosis (ICD10-I70.0). Paraseptal and centrilobular emphysema.  Emphysema (ICD10-J43.9). Small nodule at the left lung apex. Given the apparent risk factors, noncontrast chest CT can be considered in 12 months. This recommendation follows the consensus statement: Guidelines for Management of Incidental Pulmonary Nodules Detected on CT Images: From the Fleischner Society 2017; Radiology 2017; 284:228-243. Bilateral nonobstructing nephrolithiasis. Bilateral benign cysts of the kidneys. There are multiple bilateral kidney lesions which are too small to characterize. Small hiatal hernia. Re- demonstration of right liver lobe lesion, which is been previously characterized as hemangioma. Diverticular disease without evidence of acute diverticulitis. Single vessel coronary artery disease. Electronically Signed   By: Gilmer Mor D.O.   On: 03/24/2017 12:26   Ct Angio Abd/pel W And/or Wo Contrast  Result Date: 03/24/2017 CLINICAL DATA:  72 year old male with a history of chest pain and hypertension. History of abdominal aortic aneurysm EXAM: CT ANGIOGRAPHY CHEST, ABDOMEN AND PELVIS TECHNIQUE: Multidetector CT imaging through the chest, abdomen and pelvis was performed using the standard protocol during bolus administration of  intravenous contrast. Multiplanar reconstructed images and MIPs were obtained and reviewed to evaluate the vascular anatomy. CONTRAST:  ISOVUE-370 IOPAMIDOL (ISOVUE-370) INJECTION 76% COMPARISON:  08/02/2012, 09/12/2006, 06/06/2006 FINDINGS: CTA CHEST FINDINGS Cardiovascular: Heart: Cardiac diameter borderline enlarged. No pericardial fluid/ thickening. Coronary calcifications of the left anterior descending coronary artery. Minimal calcifications of the aortic valve. Aorta: Diameter of ascending thoracic aorta measures 4.4 cm. No dissection flap. Mild atherosclerotic changes of the aortic arch. Branch vessels are patent with 3 vessel aortic arch. Greatest diameter of the descending thoracic aorta above the hiatus measures 4.3 cm at site of aortic ulcer, projecting toward the anterior right. The ulcer measures 1.4 cm at the base, with an estimated depth of 1.1 cm. Overall, there has not been significant change from the comparison CT of 08/02/2012. No associated inflammatory changes within the fat. No periaortic fluid. 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 No inflammatory changes at this site. No periaortic fluid. The diameter of the aorta at the aortic hiatus appears  relatively unchanged to the CT 08/02/2012, however, no parasagittal images are available for comparison on the prior study. Pulmonary arteries: No central, lobar, segmental, or proximal subsegmental filling defects. Mediastinum/Nodes: No mediastinal adenopathy. Small lymph nodes are present. Unremarkable thoracic inlet. Unremarkable appearance of the thoracic esophagus. Lungs/Pleura: Paraseptal and centrilobular emphysema. No confluent airspace disease. No pleural effusion. No pneumothorax. No endotracheal or endobronchial debris. No bronchial wall thickening. 4 mm nodule at the left lung apex adjacent to the fissure. No pneumothorax.  Musculoskeletal: No acute displaced fracture. Degenerative changes of the thoracic spine. Review of the MIP images confirms the above findings. CTA ABDOMEN AND PELVIS FINDINGS VASCULAR Aorta: Below the aortic hiatus, were the diameter measures greatest, the diameter of the aorta is relatively unchanged from the comparison CT. No dissection flap. No periaortic fluid. Similar appearance of irregular plaque/ early ulceration projecting anterior and to the left at the level of the inferior mesenteric artery origin. Appearance is unchanged from the comparison. Diameter at the celiac artery measures 3.4 cm. Diameter at the superior mesenteric artery measures 3.0 cm. Diameter at the renal artery origins measures 2.4 cm. No inflammatory changes or periaortic fluid. Diameter of the distal aorta at the bifurcation measures 2.3 cm. Celiac: Minimal atherosclerotic changes at the origin of the celiac artery. SMA: Minimal atherosclerotic changes at the origin of the SMA. Renals: The bilateral main renal arteries are patent with no significant atherosclerotic changes. Small accessory left renal artery just anterior to the main left renal artery, as well as an additional accessory renal artery cephalad to the main renal artery. IMA: Inferior mesenteric arteries patent. 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 No dissection. Hypogastric artery originates from the aneurysm and remains patent. External iliac artery patent with no significant stenosis or occlusion. Proximal SFA and profunda femoris are patent. Left lower extremity: No dissection or aneurysm of the left common iliac artery. There appears to be stenosis of the hypogastric artery origin secondary to calcified and soft plaque. Mild atherosclerotic changes of the left external iliac artery. Common femoral artery, proximal SFA and profunda femoris patent. Veins: Unremarkable appearance  of the venous system. Review of the MIP images confirms the above findings. NON-VASCULAR Hepatobiliary: Hypodense lesion within the right liver, with only a single early arterial phase, in the region of the previously identified hemangioma. Lesion appears smaller on today's study measuring only approximately 2.5 cm. Unremarkable gallbladder. Pancreas: Unremarkable pancreas Spleen: Unremarkable spleen Adrenals/Urinary Tract: Unremarkable adrenal glands. Right: Nonobstructive stone at the superior collecting system of the right kidney measures 19 mm. No hydronephrosis. No perinephric stranding. Unremarkable course of the right ureter. Low-density lesion on the lateral cortex on image 110 compatible with a simple cyst. Small lesions at the posterior cortex are too small to characterize. Low-density lesion at the inferior cortex on image 123, compatible with simple cyst. Left: Nonobstructive stone at the anterior collecting system of the left kidney Unremarkable course of the left ureter. No perinephric stranding. No left-sided hydronephrosis. Low-density cyst at the superior cortex on image 92, simple cyst. Additional smaller low-density lesions on the anterior and lateral cortex are too small to characterize. Unremarkable appearance of the urinary bladder . Stomach/Bowel: Small hiatal hernia. Unremarkable stomach. Unremarkable small bowel with no abnormal distention. No focal wall thickening. No transition point or inflammatory changes. Normal appendix. Mild stool burden. No abnormal distention. No inflammatory changes. Mild colonic diverticular change. Lymphatic: No abdominal lymphadenopathy.  No inflammatory changes. Reproductive: Transverse diameter of the prostate measures 5.6 cm Other: Bilateral fat containing inguinal hernia. Fat containing umbilical hernia. Musculoskeletal: No acute fracture. Degenerative changes of the lumbar spine. Mild degenerative changes of the hips. IMPRESSION: No acute vascular  abnormality. 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 Diameter of the aorta at the SMA origin measures 3.0 cm. Irregular ulcerated plaque of the mid descending thoracic aorta, unchanged from the comparison CT. Diameter of the aorta at this level measures approximately 4.2 cm, with depth of the ulcer approximately 1.1 cm. No associated inflammatory changes. Additional irregular/ulcerated plaque of the infrarenal abdominal aorta at the level of the IMA. Aortic Atherosclerosis (ICD10-I70.0). Paraseptal and centrilobular emphysema.  Emphysema (ICD10-J43.9). Small nodule at the left lung apex. Given the apparent risk factors, noncontrast chest CT can be considered in 12 months. This recommendation follows the consensus statement: Guidelines for Management of Incidental Pulmonary Nodules Detected on CT Images: From the Fleischner Society 2017; Radiology 2017; 284:228-243. Bilateral nonobstructing nephrolithiasis. Bilateral benign cysts of the kidneys. There are multiple bilateral kidney lesions which are too small to characterize. Small hiatal hernia. Re- demonstration of right liver lobe lesion, which is been previously characterized as hemangioma. Diverticular disease without evidence of acute diverticulitis. Single vessel coronary artery disease. Electronically Signed   By: Gilmer Mor D.O.   On: 03/24/2017 12:26    EKG: Sinus rhythm, 82 bpm  ASSESSMENT AND PLAN:  1. Chest pain, with borderline elevated initial troponin of 0.09, with resolution of chest pain after nitroglycerin. No known history of CAD, no prior cardiac catheterizations. 2. Essential hypertension, elevated upon arrival to ER 3. Hyperlipidemia, on statin  Recommendations: 1. Agree with overall therapy 2. If second troponin elevated, will start on heparin drip and cath in  the morning. 3. If second troponin unchanged, will plan to do nuclear stress test in morning. 4. NPO starting at midnight regardless. 5. Review 2D echocardiogram  Signed: Leanora Ivanoff PA-C 03/24/2017, 4:28 PM

## 2017-03-24 NOTE — ED Notes (Signed)
Date and time results received: 03/24/17 11:26 AM   Test: Troponin Critical Value: .09 ng/ml  Name of Provider Notified: Fanny BienQuale, MD  Orders Received? Or Actions Taken?: none

## 2017-03-25 ENCOUNTER — Inpatient Hospital Stay
Admit: 2017-03-25 | Discharge: 2017-03-25 | Disposition: A | Discharge status: Home / Self Care | Payer: Medicare HMO | Attending: Internal Medicine | Admitting: Internal Medicine

## 2017-03-25 ENCOUNTER — Inpatient Hospital Stay: Payer: Medicare HMO

## 2017-03-25 DIAGNOSIS — I712 Thoracic aortic aneurysm, without rupture: Secondary | ICD-10-CM | POA: Diagnosis not present

## 2017-03-25 DIAGNOSIS — R079 Chest pain, unspecified: Secondary | ICD-10-CM

## 2017-03-25 DIAGNOSIS — F17210 Nicotine dependence, cigarettes, uncomplicated: Secondary | ICD-10-CM | POA: Diagnosis not present

## 2017-03-25 LAB — NM MYOCAR MULTI W/SPECT W/WALL MOTION / EF
CHL CUP MPHR: 151 {beats}/min
CHL CUP NUCLEAR SDS: 2
CHL CUP NUCLEAR SRS: 18
CHL CUP NUCLEAR SSS: 11
CHL CUP RESTING HR STRESS: 61 {beats}/min
CSEPED: 1 min
CSEPEW: 1 METS
CSEPPHR: 97 {beats}/min
Exercise duration (sec): 5 s
LV dias vol: 169 mL (ref 62–150)
LV sys vol: 90 mL
NUC STRESS TID: 0.84
Percent HR: 64 %

## 2017-03-25 LAB — BASIC METABOLIC PANEL
ANION GAP: 9 (ref 5–15)
BUN: 19 mg/dL (ref 6–20)
CHLORIDE: 109 mmol/L (ref 101–111)
CO2: 20 mmol/L — ABNORMAL LOW (ref 22–32)
Calcium: 8.9 mg/dL (ref 8.9–10.3)
Creatinine, Ser: 1.16 mg/dL (ref 0.61–1.24)
Glucose, Bld: 116 mg/dL — ABNORMAL HIGH (ref 65–99)
Potassium: 3.7 mmol/L (ref 3.5–5.1)
SODIUM: 138 mmol/L (ref 135–145)

## 2017-03-25 LAB — HEPARIN LEVEL (UNFRACTIONATED)
HEPARIN UNFRACTIONATED: 0.29 [IU]/mL — AB (ref 0.30–0.70)
Heparin Unfractionated: 0.41 IU/mL (ref 0.30–0.70)

## 2017-03-25 LAB — ECHOCARDIOGRAM COMPLETE
Height: 68 in
Weight: 3048 oz

## 2017-03-25 LAB — CBC
HCT: 37.3 % — ABNORMAL LOW (ref 40.0–52.0)
HEMOGLOBIN: 13 g/dL (ref 13.0–18.0)
MCH: 29.9 pg (ref 26.0–34.0)
MCHC: 34.8 g/dL (ref 32.0–36.0)
MCV: 85.8 fL (ref 80.0–100.0)
Platelets: 210 10*3/uL (ref 150–440)
RBC: 4.35 MIL/uL — AB (ref 4.40–5.90)
RDW: 15.4 % — AB (ref 11.5–14.5)
WBC: 9.2 10*3/uL (ref 3.8–10.6)

## 2017-03-25 LAB — TROPONIN I: TROPONIN I: 0.75 ng/mL — AB (ref <0.03)

## 2017-03-25 MED ORDER — OMEPRAZOLE 20 MG PO CPDR: 20.0000 mg | DELAYED_RELEASE_CAPSULE | Freq: Every day | ORAL | Status: DC

## 2017-03-25 MED ORDER — LOSARTAN POTASSIUM 50 MG PO TABS: 50.0000 mg | ORAL_TABLET | Freq: Every day | ORAL | 0 refills | Status: DC

## 2017-03-25 MED ORDER — TECHNETIUM TC 99M TETROFOSMIN IV KIT
31.2200 | PACK | Freq: Once | INTRAVENOUS | Status: AC | PRN
  Administered 2017-03-25: 31.22 via INTRAVENOUS

## 2017-03-25 MED ORDER — NITROGLYCERIN 0.4 MG SL SUBL
0.4000 mg | SUBLINGUAL_TABLET | Freq: Once | SUBLINGUAL | Status: AC
  Administered 2017-03-25: 0.4 mg via SUBLINGUAL

## 2017-03-25 MED ORDER — HEPARIN (PORCINE) IN NACL 100-0.45 UNIT/ML-% IJ SOLN: 1100.0000 [IU]/h | INTRAMUSCULAR | Status: DC

## 2017-03-25 MED ORDER — TECHNETIUM TC 99M TETROFOSMIN IV KIT
13.0000 | PACK | Freq: Once | INTRAVENOUS | Status: AC | PRN
  Administered 2017-03-25: 12.57 via INTRAVENOUS

## 2017-03-25 MED ORDER — REGADENOSON 0.4 MG/5ML IV SOLN
0.4000 mg | Freq: Once | INTRAVENOUS | Status: AC
  Administered 2017-03-25: 0.4 mg via INTRAVENOUS

## 2017-03-25 MED ORDER — HEPARIN BOLUS VIA INFUSION
1200.0000 [IU] | Freq: Once | INTRAVENOUS | Status: DC
  Filled 2017-03-25: qty 1200

## 2017-03-25 NOTE — Progress Notes (Signed)
ANTICOAGULATION CONSULT NOTE - Initial Consult  Pharmacy Consult for heparin gtt Indication: chest pain/ACS  Allergies  Allergen Reactions  . Sulfa Antibiotics     swelling    Patient Measurements: Height: 5\' 8"  (172.7 cm) Weight: 190 lb 8 oz (86.4 kg) IBW/kg (Calculated) : 68.4 Heparin Dosing Weight: 85.8kg  Vital Signs: Temp: 98 F (36.7 C) (01/03 1944) Temp Source: Oral (01/03 1944) BP: 178/86 (01/04 0027) Pulse Rate: 66 (01/04 0027)  Labs: Recent Labs    03/24/17 1040 03/24/17 1540 03/24/17 2200 03/25/17 0050  HGB 13.6  --   --   --   HCT 39.1*  --   --   --   PLT 221  --   --   --   APTT 49*  --   --   --   LABPROT 13.8  --   --   --   INR 1.07  --   --   --   HEPARINUNFRC  --   --   --  0.41  CREATININE 1.10  --   --   --   TROPONINI 0.09* 0.34* 0.82*  --     Estimated Creatinine Clearance: 67.8 mL/min (by C-G formula based on SCr of 1.1 mg/dL).   Medical History: Past Medical History:  Diagnosis Date  . AAA (abdominal aortic aneurysm) (HCC)   . BPH (benign prostatic hyperplasia)   . Elevated PSA   . HLD (hyperlipidemia)   . HTN (hypertension)   . Kidney stones     Medications:  Medications Prior to Admission  Medication Sig Dispense Refill Last Dose  . amLODipine (NORVASC) 5 MG tablet Take 1 tablet (5 mg total) by mouth daily. 90 tablet 1 03/23/2017 at Unknown time  . aspirin EC 81 MG tablet Take 81 mg by mouth daily.   Past Month at Unknown time  . baclofen (LIORESAL) 10 MG tablet take 1 tablet by mouth daily   03/23/2017 at Unknown time  . buPROPion (WELLBUTRIN SR) 150 MG 12 hr tablet Take 150 mg by mouth daily.   03/23/2017 at Unknown time  . meloxicam (MOBIC) 15 MG tablet Mobic 15 mg tablet  Take 1 tablet every day by oral route with meals.   03/23/2017 at Unknown time  . metoprolol succinate (TOPROL-XL) 50 MG 24 hr tablet Take 1 tablet (50 mg total) by mouth daily. Take with or immediately following a meal. 90 tablet 3 03/23/2017 at Unknown time  .  rosuvastatin (CRESTOR) 20 MG tablet Take 1 tablet (20 mg total) by mouth daily. 90 tablet 3 03/23/2017 at Unknown time  . tamsulosin (FLOMAX) 0.4 MG CAPS capsule Take 0.4 mg by mouth daily.   03/23/2017 at Unknown time   Scheduled:  . amLODipine  5 mg Oral Daily  . aspirin EC  81 mg Oral Daily  . cholecalciferol  1,000 Units Oral Daily  . losartan  50 mg Oral Daily  . meloxicam  15 mg Oral Daily  . metoprolol succinate  50 mg Oral Daily  . nitroGLYCERIN  0.5 inch Topical Q6H  . pantoprazole  40 mg Oral Daily  . rosuvastatin  20 mg Oral Daily  . tamsulosin  0.4 mg Oral Daily   Infusions:  . heparin 1,000 Units/hr (03/24/17 1859)   PRN: acetaminophen **OR** acetaminophen, hydrALAZINE, ondansetron **OR** ondansetron (ZOFRAN) IV Anti-infectives (From admission, onward)   None      Assessment: 72 year old male requiring acs heparin gtt management per pharmacy   Goal of Therapy:  Heparin level 0.3-0.7 units/ml Monitor platelets by anticoagulation protocol: Yes   Plan:  Give 4000 units bolus x 1 Start heparin infusion at 1000 units/hr Check anti-Xa level in 6 hours and daily while on heparin Continue to monitor H&H and platelets   01/04 0100 HL 0.41 therapeutic. Will continue current rate and will recheck HL @ 0900. Will monitor CBC w/ am labs.  Thomasene Rippleavid Jaquanna Ballentine, PharmD, BCPS Clinical Pharmacist 03/25/2017

## 2017-03-25 NOTE — Progress Notes (Signed)
ANTICOAGULATION CONSULT NOTE - Initial Consult  Pharmacy Consult for heparin gtt Indication: chest pain/ACS  Allergies  Allergen Reactions  . Sulfa Antibiotics     swelling    Patient Measurements: Height: 5\' 8"  (172.7 cm) Weight: 190 lb 8 oz (86.4 kg) IBW/kg (Calculated) : 68.4 Heparin Dosing Weight: 85 kg  Vital Signs: Temp: 98.3 F (36.8 C) (01/04 0618) Temp Source: Oral (01/04 0618) BP: 145/58 (01/04 0618) Pulse Rate: 65 (01/04 0618)  Labs: Recent Labs    03/24/17 1040 03/24/17 1540 03/24/17 2200 03/25/17 0050 03/25/17 0456 03/25/17 0849  HGB 13.6  --   --   --  13.0  --   HCT 39.1*  --   --   --  37.3*  --   PLT 221  --   --   --  210  --   APTT 49*  --   --   --   --   --   LABPROT 13.8  --   --   --   --   --   INR 1.07  --   --   --   --   --   HEPARINUNFRC  --   --   --  0.41  --  0.29*  CREATININE 1.10  --   --   --  1.16  --   TROPONINI 0.09* 0.34* 0.82*  --  0.75*  --     Estimated Creatinine Clearance: 64.3 mL/min (by C-G formula based on SCr of 1.16 mg/dL).   Assessment: Pharmacy consulted to monitor heparin in this 72 year old male for ACS. Patient was not taking anticoagulation prior to admission.  Goal of Therapy:  Heparin level 0.3-0.7 units/ml Monitor platelets by anticoagulation protocol: Yes   Plan:  HL = 0.29 is slightly subtherapeutic on a rate of 1000 units/hr. Will bolus 1200 units and increase infusion to 1100 units/hr.  Will order HL in 6 hours and CBC with AM labs tomorrow.  Cindi CarbonMary M Muslima Toppins, PharmD, BCPS Clinical Pharmacist 03/25/2017

## 2017-03-25 NOTE — Discharge Instructions (Signed)
Follow-up with primary care physician in 1 week Follow-up with vascular surgery in 1 month Follow-up with cardiology Dr. Evette Georgesparashos in 1 week

## 2017-03-25 NOTE — Care Management (Signed)
No discharge needs identified by members of the care team 

## 2017-03-25 NOTE — Discharge Summary (Signed)
[facility] [place] [facility]   PATIENT NAME: Glenn Hill    MR#:  [id number]  DATE OF BIRTH:  12-08-1945  DATE OF ADMISSION:  03/24/2017 ADMITTING PHYSICIAN: Enid Baas, MD  DATE OF DISCHARGE:  03/25/17  PRIMARY CARE PHYSICIAN: Tommie Sams, DO    ADMISSION DIAGNOSIS:  Lung nodule [R91.1] Chest pain with high risk for cardiac etiology [R07.9]  DISCHARGE DIAGNOSIS:  Active Problems:   Hypertensive urgency   SECONDARY DIAGNOSIS:   Past Medical History:  Diagnosis Date  . AAA (abdominal aortic aneurysm) (HCC)   . BPH (benign prostatic hyperplasia)   . Elevated PSA   . HLD (hyperlipidemia)   . HTN (hypertension)   . Kidney stones     HOSPITAL COURSE:    HPI Glenn Hill  is a 72 y.o. male with a known history of abdominal aortic aneurysm, hypertension, hyperlipidemia and nephrolithiasis presents to the hospital secondary to crushing chest pain that started this morning. Patient states that he has on and off chest pains for a long time. Has stress test done with his PCP as outpatient, last one being last year which were normal. This morning he was going to work, was walking from his car and suddenly felt that the chest pain has worsened and within a minute he had crushing tightness in his chest. Denies any associated dyspnea or diaphoresis. No fevers or chills, no nausea or vomiting. No cough. No sick contacts. Chest pain improved with aspirin and nitroglycerin here in the emergency room. EKG showing T-wave inversion in lateral leads. First troponin is slightly elevated at 0.09. His blood pressure was noted to be significantly elevated here in the emergency room.     1. Chest pain- unstable angina -Admitted  to telemetry, recycled troponins-0.82, 0.75 -Chest pain improved with the heparin drip -Cardiology has recommended cardiac catheterization but patient refused.  Patient had stress test done which is abnormal, but it is most  likely from the previous scar -Cardiology has recommended to discharge the patient from cardiac standpoint and outpatient follow-up in 1 week -Continue aspirin, Toprol and Cozaar .continue statin. -Echocardiogram has revealed 55-65% ejection fraction -Fasting lipid panel LDL 71 Hemoglobin A1c 5.6  2. 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 -Being followed with vascular as outpatient -CT angiogram with ulcerated aneurysm at 5 cm -Patient seen by vascular surgery, recommended outpatient follow-up no interventions needed at this time -Reinforced the importance of quitting smoking   3. Hypertensive urgency Resolved -On Norvasc and metoprolol and losartan-we will continue.  4. Hyperlipidemia-on statin  5. DVT prophylaxis-on Lovenox  Tobacco abuse disorder.  Counseled patient to quit smoking for 4-5 minutes.  He verbalized understanding.  Provide a nicotine patch   DISCHARGE CONDITIONS:   Stable   CONSULTS OBTAINED:  Treatment Team:  Marcina Millard, MD   PROCEDURES nuclear stress test  DRUG ALLERGIES:   Allergies  Allergen Reactions  . Sulfa Antibiotics     swelling    DISCHARGE MEDICATIONS:   Allergies as of 03/25/2017      Reactions   Sulfa Antibiotics    swelling      Medication List    STOP taking these medications   MOBIC 15 MG tablet Generic drug:  meloxicam     TAKE these medications   amLODipine 5 MG tablet Commonly known as:  NORVASC Take 1 tablet (5 mg total) by mouth daily.   aspirin EC 81 MG tablet Take 81 mg by mouth daily.  baclofen 10 MG tablet Commonly known as:  LIORESAL take 1 tablet by mouth daily   buPROPion 150 MG 12 hr tablet Commonly known as:  WELLBUTRIN SR Take 150 mg by mouth daily.   losartan 50 MG tablet Commonly known as:  COZAAR Take 1 tablet (50 mg total) by mouth daily.   metoprolol succinate 50 MG 24 hr tablet Commonly known as:  TOPROL-XL Take 1  tablet (50 mg total) by mouth daily. Take with or immediately following a meal.   omeprazole 20 MG capsule Commonly known as:  PRILOSEC Take 1 capsule (20 mg total) by mouth daily.   rosuvastatin 20 MG tablet Commonly known as:  CRESTOR Take 1 tablet (20 mg total) by mouth daily.   tamsulosin 0.4 MG Caps capsule Commonly known as:  FLOMAX Take 0.4 mg by mouth daily.        DISCHARGE INSTRUCTIONS:  Follow-up with primary care physician in 1 week Follow-up with vascular surgery in 1 month Follow-up with cardiology Dr. Evette Georges in 1 week   DIET:  Cardiac diet  DISCHARGE CONDITION:  Stable  ACTIVITY:  Activity as tolerated  OXYGEN:  Home Oxygen: No.   Oxygen Delivery: room air  DISCHARGE LOCATION:  home   If you experience worsening of your admission symptoms, develop shortness of breath, life threatening emergency, suicidal or homicidal thoughts you must seek medical attention immediately by calling 911 or calling your MD immediately  if symptoms less severe.  You Must read complete instructions/literature along with all the possible adverse reactions/side effects for all the Medicines you take and that have been prescribed to you. Take any new Medicines after you have completely understood and accpet all the possible adverse reactions/side effects.   Please note  You were cared for by a hospitalist during your hospital stay. If you have any questions about your discharge medications or the care you received while you were in the hospital after you are discharged, you can call the unit and asked to speak with the hospitalist on call if the hospitalist that took care of you is not available. Once you are discharged, your primary care physician will handle any further medical issues. Please note that NO REFILLS for any discharge medications will be authorized once you are discharged, as it is imperative that you return to your primary care physician (or establish a  relationship with a primary care physician if you do not have one) for your aftercare needs so that they can reassess your need for medications and monitor your lab values.     Today  Chief Complaint  Patient presents with  . Chest Pain    Patient was seen and examined after stress test.  Denies any chest pain.  Okay to discharge patient from cardiology standpoint.  Cardiologist has discussed with the patient regarding the stress test results. Patient is asymptomatic ROS:  CONSTITUTIONAL: Denies fevers, chills. Denies any fatigue, weakness.  EYES: Denies blurry vision, double vision, eye pain. EARS, NOSE, THROAT: Denies tinnitus, ear pain, hearing loss. RESPIRATORY: Denies cough, wheeze, shortness of breath.  CARDIOVASCULAR: Denies chest pain, palpitations, edema.  GASTROINTESTINAL: Denies nausea, vomiting, diarrhea, abdominal pain. Denies bright red blood per rectum. GENITOURINARY: Denies dysuria, hematuria. ENDOCRINE: Denies nocturia or thyroid problems. HEMATOLOGIC AND LYMPHATIC: Denies easy bruising or bleeding. SKIN: Denies rash or lesion. MUSCULOSKELETAL: Denies pain in neck, back, shoulder, knees, hips or arthritic symptoms.  NEUROLOGIC: Denies paralysis, paresthesias.  PSYCHIATRIC: Denies anxiety or depressive symptoms.   VITAL SIGNS:  Blood  pressure (!) 145/58, pulse 65, temperature 98.3 F (36.8 C), temperature source Oral, resp. rate 16, height 5\' 8"  (1.727 m), weight 86.4 kg (190 lb 8 oz), SpO2 95 %.  I/O:    Intake/Output Summary (Last 24 hours) at 03/25/2017 1407 Last data filed at 03/25/2017 0600 Gross per 24 hour  Intake 350.17 ml  Output 225 ml  Net 125.17 ml    PHYSICAL EXAMINATION:  GENERAL:  72 y.o.-year-old patient lying in the bed with no acute distress.  EYES: Pupils equal, round, reactive to light and accommodation. No scleral icterus. Extraocular muscles intact.  HEENT: Head atraumatic, normocephalic. Oropharynx and nasopharynx clear.  NECK:   Supple, no jugular venous distention. No thyroid enlargement, no tenderness.  LUNGS: Normal breath sounds bilaterally, no wheezing, rales,rhonchi or crepitation. No use of accessory muscles of respiration.  CARDIOVASCULAR: S1, S2 normal. No murmurs, rubs, or gallops.  ABDOMEN: Soft, non-tender, non-distended. Bowel sounds present. No organomegaly or mass.  EXTREMITIES: No pedal edema, cyanosis, or clubbing.  NEUROLOGIC: Cranial nerves II through XII are intact. Muscle strength 5/5 in all extremities. Sensation intact. Gait not checked.  PSYCHIATRIC: The patient is alert and oriented x 3.  SKIN: No obvious rash, lesion, or ulcer.   DATA REVIEW:   CBC Recent Labs  Lab 03/25/17 0456  WBC 9.2  HGB 13.0  HCT 37.3*  PLT 210    Chemistries  Recent Labs  Lab 03/25/17 0456  NA 138  K 3.7  CL 109  CO2 20*  GLUCOSE 116*  BUN 19  CREATININE 1.16  CALCIUM 8.9    Cardiac Enzymes Recent Labs  Lab 03/25/17 0456  TROPONINI 0.75*    Microbiology Results  No results found for this or any previous visit.  RADIOLOGY:  Nm Myocar Multi W/spect W/wall Motion / Ef  Result Date: 03/25/2017  Blood pressure demonstrated a normal response to exercise.  There was no ST segment deviation noted during stress.  Defect 1: There is a medium defect of mild severity present in the mid inferior, apical inferior and apex location.  Findings consistent with prior myocardial infarction.  This is a low risk study.  The left ventricular ejection fraction is moderately decreased (30-44%).    Ct Angio Chest Aorta W And/or Wo Contrast  Result Date: 03/24/2017 CLINICAL DATA:  72 year old male with a history of chest pain and hypertension. History of abdominal aortic aneurysm EXAM: CT ANGIOGRAPHY CHEST, ABDOMEN AND PELVIS TECHNIQUE: Multidetector CT imaging through the chest, abdomen and pelvis was performed using the standard protocol during bolus administration of intravenous contrast. Multiplanar  reconstructed images and MIPs were obtained and reviewed to evaluate the vascular anatomy. CONTRAST:  ISOVUE-370 IOPAMIDOL (ISOVUE-370) INJECTION 76% COMPARISON:  08/02/2012, 09/12/2006, 06/06/2006 FINDINGS: CTA CHEST FINDINGS Cardiovascular: Heart: Cardiac diameter borderline enlarged. No pericardial fluid/ thickening. Coronary calcifications of the left anterior descending coronary artery. Minimal calcifications of the aortic valve. Aorta: Diameter of ascending thoracic aorta measures 4.4 cm. No dissection flap. Mild atherosclerotic changes of the aortic arch. Branch vessels are patent with 3 vessel aortic arch. Greatest diameter of the descending thoracic aorta above the hiatus measures 4.3 cm at site of aortic ulcer, projecting toward the anterior right. The ulcer measures 1.4 cm at the base, with an estimated depth of 1.1 cm. Overall, there has not been significant change from the comparison CT of 08/02/2012. No associated inflammatory changes within the fat. No periaortic fluid. Aneurysm at the aortic hiatus, with the greatest diameter measuring 5.2 cm on the  parasagittal images, measured perpendicular to the flow channel. No inflammatory changes at this site. No periaortic fluid. The diameter of the aorta at the aortic hiatus appears relatively unchanged to the CT 08/02/2012, however, no parasagittal images are available for comparison on the prior study. Pulmonary arteries: No central, lobar, segmental, or proximal subsegmental filling defects. Mediastinum/Nodes: No mediastinal adenopathy. Small lymph nodes are present. Unremarkable thoracic inlet. Unremarkable appearance of the thoracic esophagus. Lungs/Pleura: Paraseptal and centrilobular emphysema. No confluent airspace disease. No pleural effusion. No pneumothorax. No endotracheal or endobronchial debris. No bronchial wall thickening. 4 mm nodule at the left lung apex adjacent to the fissure. No pneumothorax. Musculoskeletal: No acute displaced  fracture. Degenerative changes of the thoracic spine. Review of the MIP images confirms the above findings. CTA ABDOMEN AND PELVIS FINDINGS VASCULAR Aorta: Below the aortic hiatus, were the diameter measures greatest, the diameter of the aorta is relatively unchanged from the comparison CT. No dissection flap. No periaortic fluid. Similar appearance of irregular plaque/ early ulceration projecting anterior and to the left at the level of the inferior mesenteric artery origin. Appearance is unchanged from the comparison. Diameter at the celiac artery measures 3.4 cm. Diameter at the superior mesenteric artery measures 3.0 cm. Diameter at the renal artery origins measures 2.4 cm. No inflammatory changes or periaortic fluid. Diameter of the distal aorta at the bifurcation measures 2.3 cm. Celiac: Minimal atherosclerotic changes at the origin of the celiac artery. SMA: Minimal atherosclerotic changes at the origin of the SMA. Renals: The bilateral main renal arteries are patent with no significant atherosclerotic changes. Small accessory left renal artery just anterior to the main left renal artery, as well as an additional accessory renal artery cephalad to the main renal artery. IMA: Inferior mesenteric arteries patent. 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 No dissection. Hypogastric artery originates from the aneurysm and remains patent. External iliac artery patent with no significant stenosis or occlusion. Proximal SFA and profunda femoris are patent. Left lower extremity: No dissection or aneurysm of the left common iliac artery. There appears to be stenosis of the hypogastric artery origin secondary to calcified and soft plaque. Mild atherosclerotic changes of the left external iliac artery. Common femoral artery, proximal SFA and profunda femoris patent. Veins: Unremarkable appearance of the venous system. Review of the  MIP images confirms the above findings. NON-VASCULAR Hepatobiliary: Hypodense lesion within the right liver, with only a single early arterial phase, in the region of the previously identified hemangioma. Lesion appears smaller on today's study measuring only approximately 2.5 cm. Unremarkable gallbladder. Pancreas: Unremarkable pancreas Spleen: Unremarkable spleen Adrenals/Urinary Tract: Unremarkable adrenal glands. Right: Nonobstructive stone at the superior collecting system of the right kidney measures 19 mm. No hydronephrosis. No perinephric stranding. Unremarkable course of the right ureter. Low-density lesion on the lateral cortex on image 110 compatible with a simple cyst. Small lesions at the posterior cortex are too small to characterize. Low-density lesion at the inferior cortex on image 123, compatible with simple cyst. Left: Nonobstructive stone at the anterior collecting system of the left kidney Unremarkable course of the left ureter. No perinephric stranding. No left-sided hydronephrosis. Low-density cyst at the superior cortex on image 92, simple cyst. Additional smaller low-density lesions on the anterior and lateral cortex are too small to characterize. Unremarkable appearance of the urinary bladder . Stomach/Bowel: Small hiatal hernia. Unremarkable stomach. Unremarkable small bowel with no abnormal distention. No focal wall  thickening. No transition point or inflammatory changes. Normal appendix. Mild stool burden. No abnormal distention. No inflammatory changes. Mild colonic diverticular change. Lymphatic: No abdominal lymphadenopathy.  No inflammatory changes. Reproductive: Transverse diameter of the prostate measures 5.6 cm Other: Bilateral fat containing inguinal hernia. Fat containing umbilical hernia. Musculoskeletal: No acute fracture. Degenerative changes of the lumbar spine. Mild degenerative changes of the hips. IMPRESSION: No acute vascular abnormality. Re- demonstration of  thoracoabdominal aneurysm which involves distal thoracic aorta, with the greatest diameter at the aortic hiatus of 5.2 cm (parasagittal images), extending to the level of the celiac artery. Diameter of the aorta at the SMA origin measures 3.0 cm. Irregular ulcerated plaque of the mid descending thoracic aorta, unchanged from the comparison CT. Diameter of the aorta at this level measures approximately 4.2 cm, with depth of the ulcer approximately 1.1 cm. No associated inflammatory changes. Additional irregular/ulcerated plaque of the infrarenal abdominal aorta at the level of the IMA. Aortic Atherosclerosis (ICD10-I70.0). Paraseptal and centrilobular emphysema.  Emphysema (ICD10-J43.9). Small nodule at the left lung apex. Given the apparent risk factors, noncontrast chest CT can be considered in 12 months. This recommendation follows the consensus statement: Guidelines for Management of Incidental Pulmonary Nodules Detected on CT Images: From the Fleischner Society 2017; Radiology 2017; 284:228-243. Bilateral nonobstructing nephrolithiasis. Bilateral benign cysts of the kidneys. There are multiple bilateral kidney lesions which are too small to characterize. Small hiatal hernia. Re- demonstration of right liver lobe lesion, which is been previously characterized as hemangioma. Diverticular disease without evidence of acute diverticulitis. Single vessel coronary artery disease. Electronically Signed   By: Gilmer Mor D.O.   On: 03/24/2017 12:26   Ct Angio Abd/pel W And/or Wo Contrast  Result Date: 03/24/2017 CLINICAL DATA:  72 year old male with a history of chest pain and hypertension. History of abdominal aortic aneurysm EXAM: CT ANGIOGRAPHY CHEST, ABDOMEN AND PELVIS TECHNIQUE: Multidetector CT imaging through the chest, abdomen and pelvis was performed using the standard protocol during bolus administration of intravenous contrast. Multiplanar reconstructed images and MIPs were obtained and reviewed to  evaluate the vascular anatomy. CONTRAST:  ISOVUE-370 IOPAMIDOL (ISOVUE-370) INJECTION 76% COMPARISON:  08/02/2012, 09/12/2006, 06/06/2006 FINDINGS: CTA CHEST FINDINGS Cardiovascular: Heart: Cardiac diameter borderline enlarged. No pericardial fluid/ thickening. Coronary calcifications of the left anterior descending coronary artery. Minimal calcifications of the aortic valve. Aorta: Diameter of ascending thoracic aorta measures 4.4 cm. No dissection flap. Mild atherosclerotic changes of the aortic arch. Branch vessels are patent with 3 vessel aortic arch. Greatest diameter of the descending thoracic aorta above the hiatus measures 4.3 cm at site of aortic ulcer, projecting toward the anterior right. The ulcer measures 1.4 cm at the base, with an estimated depth of 1.1 cm. Overall, there has not been significant change from the comparison CT of 08/02/2012. No associated inflammatory changes within the fat. No periaortic fluid. 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 No inflammatory changes at this site. No periaortic fluid. The diameter of the aorta at the aortic hiatus appears relatively unchanged to the CT 08/02/2012, however, no parasagittal images are available for comparison on the prior study. Pulmonary arteries: No central, lobar, segmental, or proximal subsegmental filling defects. Mediastinum/Nodes: No mediastinal adenopathy. Small lymph nodes are present. Unremarkable thoracic inlet. Unremarkable appearance of the thoracic esophagus. Lungs/Pleura: Paraseptal and centrilobular emphysema. No confluent airspace disease. No pleural effusion. No pneumothorax. No endotracheal or endobronchial debris. No bronchial wall  thickening. 4 mm nodule at the left lung apex adjacent to the fissure. No pneumothorax. Musculoskeletal: No acute displaced fracture. Degenerative changes of the thoracic spine. Review of  the MIP images confirms the above findings. CTA ABDOMEN AND PELVIS FINDINGS VASCULAR Aorta: Below the aortic hiatus, were the diameter measures greatest, the diameter of the aorta is relatively unchanged from the comparison CT. No dissection flap. No periaortic fluid. Similar appearance of irregular plaque/ early ulceration projecting anterior and to the left at the level of the inferior mesenteric artery origin. Appearance is unchanged from the comparison. Diameter at the celiac artery measures 3.4 cm. Diameter at the superior mesenteric artery measures 3.0 cm. Diameter at the renal artery origins measures 2.4 cm. No inflammatory changes or periaortic fluid. Diameter of the distal aorta at the bifurcation measures 2.3 cm. Celiac: Minimal atherosclerotic changes at the origin of the celiac artery. SMA: Minimal atherosclerotic changes at the origin of the SMA. Renals: The bilateral main renal arteries are patent with no significant atherosclerotic changes. Small accessory left renal artery just anterior to the main left renal artery, as well as an additional accessory renal artery cephalad to the main renal artery. IMA: Inferior mesenteric arteries patent. 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 No dissection. Hypogastric artery originates from the aneurysm and remains patent. External iliac artery patent with no significant stenosis or occlusion. Proximal SFA and profunda femoris are patent. Left lower extremity: No dissection or aneurysm of the left common iliac artery. There appears to be stenosis of the hypogastric artery origin secondary to calcified and soft plaque. Mild atherosclerotic changes of the left external iliac artery. Common femoral artery, proximal SFA and profunda femoris patent. Veins: Unremarkable appearance of the venous system. Review of the MIP images confirms the above findings. NON-VASCULAR  Hepatobiliary: Hypodense lesion within the right liver, with only a single early arterial phase, in the region of the previously identified hemangioma. Lesion appears smaller on today's study measuring only approximately 2.5 cm. Unremarkable gallbladder. Pancreas: Unremarkable pancreas Spleen: Unremarkable spleen Adrenals/Urinary Tract: Unremarkable adrenal glands. Right: Nonobstructive stone at the superior collecting system of the right kidney measures 19 mm. No hydronephrosis. No perinephric stranding. Unremarkable course of the right ureter. Low-density lesion on the lateral cortex on image 110 compatible with a simple cyst. Small lesions at the posterior cortex are too small to characterize. Low-density lesion at the inferior cortex on image 123, compatible with simple cyst. Left: Nonobstructive stone at the anterior collecting system of the left kidney Unremarkable course of the left ureter. No perinephric stranding. No left-sided hydronephrosis. Low-density cyst at the superior cortex on image 92, simple cyst. Additional smaller low-density lesions on the anterior and lateral cortex are too small to characterize. Unremarkable appearance of the urinary bladder . Stomach/Bowel: Small hiatal hernia. Unremarkable stomach. Unremarkable small bowel with no abnormal distention. No focal wall thickening. No transition point or inflammatory changes. Normal appendix. Mild stool burden. No abnormal distention. No inflammatory changes. Mild colonic diverticular change. Lymphatic: No abdominal lymphadenopathy.  No inflammatory changes. Reproductive: Transverse diameter of the prostate measures 5.6 cm Other: Bilateral fat containing inguinal hernia. Fat containing umbilical hernia. Musculoskeletal: No acute fracture. Degenerative changes of the lumbar spine. Mild degenerative changes of the hips. IMPRESSION: No acute vascular abnormality. Re- demonstration of thoracoabdominal aneurysm which involves distal thoracic aorta,  with the greatest diameter at the aortic hiatus of 5.2 cm (parasagittal images), extending to  the level of the celiac artery. Diameter of the aorta at the SMA origin measures 3.0 cm. Irregular ulcerated plaque of the mid descending thoracic aorta, unchanged from the comparison CT. Diameter of the aorta at this level measures approximately 4.2 cm, with depth of the ulcer approximately 1.1 cm. No associated inflammatory changes. Additional irregular/ulcerated plaque of the infrarenal abdominal aorta at the level of the IMA. Aortic Atherosclerosis (ICD10-I70.0). Paraseptal and centrilobular emphysema.  Emphysema (ICD10-J43.9). Small nodule at the left lung apex. Given the apparent risk factors, noncontrast chest CT can be considered in 12 months. This recommendation follows the consensus statement: Guidelines for Management of Incidental Pulmonary Nodules Detected on CT Images: From the [employer]ner [employer] 2017; Radiology 2017; 284:228-243. Bilateral nonobstructing nephrolithiasis. Bilateral benign cysts of the kidneys. There are multiple bilateral kidney lesions which are too small to characterize. Small hiatal hernia. Re- demonstration of right liver lobe lesion, which is been previously characterized as hemangioma. Diverticular disease without evidence of acute diverticulitis. Single vessel coronary artery disease. Electronically Signed   By: Gilmer Mor D.O.   On: 03/24/2017 12:26    EKG:   Orders placed or performed during the hospital encounter of 03/24/17  . ED EKG  . ED EKG  . EKG 12-Lead  . EKG 12-Lead      Management plans discussed with the patient, family and they are in agreement.  CODE STATUS:     Code Status Orders  (From admission, onward)        Start     Ordered   03/24/17 1534  Full code  Continuous     03/24/17 1533    Code Status History    Date Active Date Inactive Code Status Order ID Comments User Context   This patient has a current code status but no historical  code status.      TOTAL TIME TAKING CARE OF THIS PATIENT: 43  minutes.   Note: This dictation was prepared with Dragon dictation along with smaller phrase technology. Any transcriptional errors that result from this process are unintentional.   @[facility] @  on 03/25/2017 at 2:07 PM  Between 7am to 6pm - Pager - [phone]  After 6pm go to [email or web address] - password [email or web address] [facility]  [facility]  Office  [phone]  CC: Primary care physician; Tommie Sams, DO

## 2017-03-25 NOTE — Consult Note (Signed)
[facility] Vascular Consult Note  MRN : [id number]  Glenn Hill is a 72 y.o. (1945-08-14) male who presents with chief complaint of  Chief Complaint  Patient presents with  . Chest Pain   History of Present Illness:  The patient is a 72 year old male with a past medical history of hypertension, hyperlipidemia, peripheral artery disease, tobacco abuse iliac and thoracic aortic aneurysms who was initially seen in our office January 2018 for evaluation of said aneurysms.  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  The patient was also complaining of left lower extremity rest pain-like symptoms however denied any chest or abdominal pain.  He endorsed a history of previous endovascular interventions to the left lower extremity in the past.  A CT Angie of the chest was ordered for April 21, 2016 in an effort to obtain new images however the patient did not attend this appointment or follow-up in our office.  Patient endorses experience pain while at the grocery store yesterday.  The patient called 911 and was transferred to [facility] for medical evaluation.  The patient was found to have elevated troponin, EKG abnormality and elevated systolic blood pressures.    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 Diameter of the aorta at the SMA origin measures 3.0 cm. Irregular ulcerated plaque of the mid descending thoracic aorta, unchanged from the comparison CT. Diameter of the aorta at this level measures approximately 4.2 cm, with depth of the ulcer approximately 1.1 cm. No associated inflammatory changes. 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 No dissection.   Vascular surgery service was consulted by Dr. Nemiah Commander for recommendations in regard to the patient's thoracoabdominal aneurysm.  Unfortunately, the patient was discharged before this consultation was completed.  Our office will reach out to the patient on Monday to set up an appointment to be seen to continue care.  Current Facility-Administered Medications  Medication Dose Route Frequency Provider Last Rate Last Dose  . acetaminophen (TYLENOL) tablet 650 mg  650 mg Oral Q6H PRN Enid Baas, MD   650 mg at 03/25/17 1303   Or  . acetaminophen (TYLENOL) suppository 650 mg  650 mg Rectal Q6H PRN Enid Baas, MD      . amLODipine (NORVASC) tablet 5 mg  5 mg Oral Daily Sharyn Creamer, MD   5 mg at 03/25/17 1333  . aspirin EC tablet 81 mg  81 mg Oral Daily Enid Baas, MD   81 mg at 03/25/17 1333  . cholecalciferol (VITAMIN D) tablet 1,000 Units  1,000 Units Oral Daily Enid Baas, MD   1,000 Units at 03/25/17 1333  . hydrALAZINE (APRESOLINE) injection 10 mg  10 mg Intravenous Q6H PRN Enid Baas, MD   10 mg at 03/25/17 0043  . losartan (COZAAR) tablet 50 mg  50 mg Oral Daily Sharyn Creamer, MD   50 mg at 03/25/17 1333  . meloxicam (MOBIC) tablet 15 mg  15 mg Oral Daily Enid Baas, MD   15 mg at 03/25/17 1333  . metoprolol succinate (TOPROL-XL) 24 hr tablet 50 mg  50 mg Oral Daily Kalisetti,  Radhika, MD   50 mg at 03/25/17 1331  . nitroGLYCERIN (NITROGLYN) 2 % ointment 0.5 inch  0.5 inch Topical Q6H Enid Baas, MD      . ondansetron (ZOFRAN) tablet 4 mg  4 mg Oral Q6H PRN Enid Baas, MD       Or  . ondansetron (ZOFRAN) injection 4 mg  4 mg Intravenous Q6H PRN Enid Baas, MD      . pantoprazole (PROTONIX) EC tablet 40 mg  40 mg Oral Daily Enid Baas, MD   40 mg at 03/25/17 1333  . rosuvastatin (CRESTOR) tablet 20 mg  20 mg Oral Daily  Enid Baas, MD   20 mg at 03/25/17 1332  . tamsulosin (FLOMAX) capsule 0.4 mg  0.4 mg Oral Daily Enid Baas, MD   0.4 mg at 03/25/17 1332   Current Outpatient Medications  Medication Sig Dispense Refill  . amLODipine (NORVASC) 5 MG tablet Take 1 tablet (5 mg total) by mouth daily. 90 tablet 1  . aspirin EC 81 MG tablet Take 81 mg by mouth daily.    . baclofen (LIORESAL) 10 MG tablet take 1 tablet by mouth daily    . buPROPion (WELLBUTRIN SR) 150 MG 12 hr tablet Take 150 mg by mouth daily.    . metoprolol succinate (TOPROL-XL) 50 MG 24 hr tablet Take 1 tablet (50 mg total) by mouth daily. Take with or immediately following a meal. 90 tablet 3  . rosuvastatin (CRESTOR) 20 MG tablet Take 1 tablet (20 mg total) by mouth daily. 90 tablet 3  . tamsulosin (FLOMAX) 0.4 MG CAPS capsule Take 0.4 mg by mouth daily.    Marland Kitchen losartan (COZAAR) 50 MG tablet Take 1 tablet (50 mg total) by mouth daily. 30 tablet 0  . omeprazole (PRILOSEC) 20 MG capsule Take 1 capsule (20 mg total) by mouth daily.     Past Medical History:  Diagnosis Date  . AAA (abdominal aortic aneurysm) (HCC)   . BPH (benign prostatic hyperplasia)   . Elevated PSA   . HLD (hyperlipidemia)   . HTN (hypertension)   . Kidney stones    Past Surgical History:  Procedure Laterality Date  . Leg circulation Surgery Left    Social History Social History   Tobacco Use  . Smoking status: Current Every Day Smoker    Packs/day: 0.50    Years: 55.00    Pack years: 27.50    Types: Cigarettes  . Smokeless tobacco: Never Used  Substance Use Topics  . Alcohol use: No    Alcohol/week: 0.0 oz  . Drug use: No   Family History Family History  Problem Relation Age of Onset  . Dementia Father   . Heart attack Father   . Alzheimer's disease Mother   . CAD Brother   The patient denies a family history of peripheral artery, venous or renal disease.  Allergies  Allergen Reactions  . Sulfa Antibiotics     swelling   REVIEW  OF SYSTEMS (Negative unless checked)  Unable to be completed  Physical Examination  Unable to be completed  Vitals:   03/24/17 1805 03/24/17 1944 03/25/17 0027 03/25/17 0618  BP: (!) 150/78 137/70 (!) 178/86 (!) 145/58  Pulse: 71 76 66 65  Resp:  18  16  Temp:  98 F (36.7 C)  98.3 F (36.8 C)  TempSrc:  Oral  Oral  SpO2:  99%  95%  Weight:      Height:       Body mass  index is 28.97 kg/m.   CBC Lab Results  Component Value Date   WBC 9.2 03/25/2017   HGB 13.0 03/25/2017   HCT 37.3 (L) 03/25/2017   MCV 85.8 03/25/2017   PLT 210 03/25/2017   BMET    Component Value Date/Time   NA 138 03/25/2017 0456   NA 142 05/25/2013 1943   K 3.7 03/25/2017 0456   K 3.8 05/25/2013 1943   CL 109 03/25/2017 0456   CL 110 (H) 05/25/2013 1943   CO2 20 (L) 03/25/2017 0456   CO2 26 05/25/2013 1943   GLUCOSE 116 (H) 03/25/2017 0456   GLUCOSE 99 05/25/2013 1943   BUN 19 03/25/2017 0456   BUN 17 05/25/2013 1943   CREATININE 1.16 03/25/2017 0456   CREATININE 1.16 05/25/2013 1943   CALCIUM 8.9 03/25/2017 0456   CALCIUM 9.1 05/25/2013 1943   GFRNONAA >60 03/25/2017 0456   GFRNONAA >60 05/25/2013 1943   GFRAA >60 03/25/2017 0456   GFRAA >60 05/25/2013 1943   Estimated Creatinine Clearance: 64.3 mL/min (by C-G formula based on SCr of 1.16 mg/dL).  COAG Lab Results  Component Value Date   INR 1.07 03/24/2017   Radiology Nm Myocar Multi W/spect W/wall Motion / Ef  Result Date: 03/25/2017  Blood pressure demonstrated a normal response to exercise.  There was no ST segment deviation noted during stress.  Defect 1: There is a medium defect of mild severity present in the mid inferior, apical inferior and apex location.  Findings consistent with prior myocardial infarction.  This is a low risk study.  The left ventricular ejection fraction is moderately decreased (30-44%).    Ct Angio Chest Aorta W And/or Wo Contrast  Result Date: 03/24/2017 CLINICAL DATA:  71 year old male  with a history of chest pain and hypertension. History of abdominal aortic aneurysm EXAM: CT ANGIOGRAPHY CHEST, ABDOMEN AND PELVIS TECHNIQUE: Multidetector CT imaging through the chest, abdomen and pelvis was performed using the standard protocol during bolus administration of intravenous contrast. Multiplanar reconstructed images and MIPs were obtained and reviewed to evaluate the vascular anatomy. CONTRAST:  ISOVUE-370 IOPAMIDOL (ISOVUE-370) INJECTION 76% COMPARISON:  08/02/2012, 09/12/2006, 06/06/2006 FINDINGS: CTA CHEST FINDINGS Cardiovascular: Heart: Cardiac diameter borderline enlarged. No pericardial fluid/ thickening. Coronary calcifications of the left anterior descending coronary artery. Minimal calcifications of the aortic valve. Aorta: Diameter of ascending thoracic aorta measures 4.4 cm. No dissection flap. Mild atherosclerotic changes of the aortic arch. Branch vessels are patent with 3 vessel aortic arch. Greatest diameter of the descending thoracic aorta above the hiatus measures 4.3 cm at site of aortic ulcer, projecting toward the anterior right. The ulcer measures 1.4 cm at the base, with an estimated depth of 1.1 cm. Overall, there has not been significant change from the comparison CT of 08/02/2012. No associated inflammatory changes within the fat. No periaortic fluid. 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 No inflammatory changes at this site. No periaortic fluid. The diameter of the aorta at the aortic hiatus appears relatively unchanged to the CT 08/02/2012, however, no parasagittal images are available for comparison on the prior study. Pulmonary arteries: No central, lobar, segmental, or proximal subsegmental filling defects. Mediastinum/Nodes: No mediastinal adenopathy. Small lymph nodes are present. Unremarkable thoracic inlet. Unremarkable appearance of the thoracic  esophagus. Lungs/Pleura: Paraseptal and centrilobular emphysema. No confluent airspace disease. No pleural effusion. No pneumothorax. No endotracheal or endobronchial debris. No bronchial wall thickening. 4  mm nodule at the left lung apex adjacent to the fissure. No pneumothorax. Musculoskeletal: No acute displaced fracture. Degenerative changes of the thoracic spine. Review of the MIP images confirms the above findings. CTA ABDOMEN AND PELVIS FINDINGS VASCULAR Aorta: Below the aortic hiatus, were the diameter measures greatest, the diameter of the aorta is relatively unchanged from the comparison CT. No dissection flap. No periaortic fluid. Similar appearance of irregular plaque/ early ulceration projecting anterior and to the left at the level of the inferior mesenteric artery origin. Appearance is unchanged from the comparison. Diameter at the celiac artery measures 3.4 cm. Diameter at the superior mesenteric artery measures 3.0 cm. Diameter at the renal artery origins measures 2.4 cm. No inflammatory changes or periaortic fluid. Diameter of the distal aorta at the bifurcation measures 2.3 cm. Celiac: Minimal atherosclerotic changes at the origin of the celiac artery. SMA: Minimal atherosclerotic changes at the origin of the SMA. Renals: The bilateral main renal arteries are patent with no significant atherosclerotic changes. Small accessory left renal artery just anterior to the main left renal artery, as well as an additional accessory renal artery cephalad to the main renal artery. IMA: Inferior mesenteric arteries patent. 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 No dissection. Hypogastric artery originates from the aneurysm and remains patent. External iliac artery patent with no significant stenosis or occlusion. Proximal SFA and profunda femoris are patent. Left lower extremity: No dissection or aneurysm of the left  common iliac artery. There appears to be stenosis of the hypogastric artery origin secondary to calcified and soft plaque. Mild atherosclerotic changes of the left external iliac artery. Common femoral artery, proximal SFA and profunda femoris patent. Veins: Unremarkable appearance of the venous system. Review of the MIP images confirms the above findings. NON-VASCULAR Hepatobiliary: Hypodense lesion within the right liver, with only a single early arterial phase, in the region of the previously identified hemangioma. Lesion appears smaller on today's study measuring only approximately 2.5 cm. Unremarkable gallbladder. Pancreas: Unremarkable pancreas Spleen: Unremarkable spleen Adrenals/Urinary Tract: Unremarkable adrenal glands. Right: Nonobstructive stone at the superior collecting system of the right kidney measures 19 mm. No hydronephrosis. No perinephric stranding. Unremarkable course of the right ureter. Low-density lesion on the lateral cortex on image 110 compatible with a simple cyst. Small lesions at the posterior cortex are too small to characterize. Low-density lesion at the inferior cortex on image 123, compatible with simple cyst. Left: Nonobstructive stone at the anterior collecting system of the left kidney Unremarkable course of the left ureter. No perinephric stranding. No left-sided hydronephrosis. Low-density cyst at the superior cortex on image 92, simple cyst. Additional smaller low-density lesions on the anterior and lateral cortex are too small to characterize. Unremarkable appearance of the urinary bladder . Stomach/Bowel: Small hiatal hernia. Unremarkable stomach. Unremarkable small bowel with no abnormal distention. No focal wall thickening. No transition point or inflammatory changes. Normal appendix. Mild stool burden. No abnormal distention. No inflammatory changes. Mild colonic diverticular change. Lymphatic: No abdominal lymphadenopathy.  No inflammatory changes. Reproductive:  Transverse diameter of the prostate measures 5.6 cm Other: Bilateral fat containing inguinal hernia. Fat containing umbilical hernia. Musculoskeletal: No acute fracture. Degenerative changes of the lumbar spine. Mild degenerative changes of the hips. IMPRESSION: No acute vascular abnormality. 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 Diameter of the aorta at the SMA origin measures 3.0 cm. Irregular ulcerated plaque of the mid descending thoracic aorta, unchanged from the comparison CT. Diameter of the aorta at this level measures approximately 4.2 cm, with depth of the ulcer approximately 1.1 cm. No associated inflammatory changes. Additional irregular/ulcerated plaque of the infrarenal abdominal aorta at the level of the IMA. Aortic Atherosclerosis (ICD10-I70.0). Paraseptal and centrilobular emphysema.  Emphysema (ICD10-J43.9). Small nodule at the left lung apex. Given the apparent risk factors, noncontrast chest CT can be considered in 12 months. This recommendation follows the consensus statement: Guidelines for Management of Incidental Pulmonary Nodules Detected on CT Images: From the Fleischner Society 2017; Radiology 2017; 284:228-243. Bilateral nonobstructing nephrolithiasis. Bilateral benign cysts of the kidneys. There are multiple bilateral kidney lesions which are too small to characterize. Small hiatal hernia. Re- demonstration of right liver lobe lesion, which is been previously characterized as hemangioma. Diverticular disease without evidence of acute diverticulitis. Single vessel coronary artery disease. Electronically Signed   By: Gilmer MorJaime  Wagner D.O.   On: 03/24/2017 12:26   Ct Angio Abd/pel W And/or Wo Contrast  Result Date: 03/24/2017 CLINICAL DATA:  72 year old male with a history of chest pain and hypertension. History of  abdominal aortic aneurysm EXAM: CT ANGIOGRAPHY CHEST, ABDOMEN AND PELVIS TECHNIQUE: Multidetector CT imaging through the chest, abdomen and pelvis was performed using the standard protocol during bolus administration of intravenous contrast. Multiplanar reconstructed images and MIPs were obtained and reviewed to evaluate the vascular anatomy. CONTRAST:  100mL ISOVUE-370 IOPAMIDOL (ISOVUE-370) INJECTION 76% COMPARISON:  08/02/2012, 09/12/2006, 06/06/2006 FINDINGS: CTA CHEST FINDINGS Cardiovascular: Heart: Cardiac diameter borderline enlarged. No pericardial fluid/ thickening. Coronary calcifications of the left anterior descending coronary artery. Minimal calcifications of the aortic valve. Aorta: Diameter of ascending thoracic aorta measures 4.4 cm. No dissection flap. Mild atherosclerotic changes of the aortic arch. Branch vessels are patent with 3 vessel aortic arch. Greatest diameter of the descending thoracic aorta above the hiatus measures 4.3 cm at site of aortic ulcer, projecting toward the anterior right. The ulcer measures 1.4 cm at the base, with an estimated depth of 1.1 cm. Overall, there has not been significant change from the comparison CT of 08/02/2012. No associated inflammatory changes within the fat. No periaortic fluid. 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 No inflammatory changes at this site. No periaortic fluid. The diameter of the aorta at the aortic hiatus appears relatively unchanged to the CT 08/02/2012, however, no parasagittal images are available for comparison on the prior study. Pulmonary arteries: No central, lobar, segmental, or proximal subsegmental filling defects. Mediastinum/Nodes: No mediastinal adenopathy. Small lymph nodes are present. Unremarkable thoracic inlet. Unremarkable appearance of the thoracic esophagus. Lungs/Pleura: Paraseptal and centrilobular emphysema.  No confluent airspace disease. No pleural effusion. No pneumothorax. No endotracheal or endobronchial debris. No bronchial wall thickening. 4 mm nodule at the left lung apex adjacent to the fissure. No pneumothorax. Musculoskeletal: No acute displaced fracture. Degenerative changes of the thoracic spine. Review of the MIP images confirms the above findings. CTA ABDOMEN AND PELVIS FINDINGS VASCULAR Aorta: Below the aortic hiatus, were the diameter measures greatest, the diameter of the aorta is relatively unchanged from the comparison CT. No dissection flap. No periaortic fluid. Similar appearance of irregular plaque/ early ulceration projecting anterior and to the left at the level of the inferior mesenteric artery origin. Appearance is unchanged from the comparison. Diameter  at the celiac artery measures 3.4 cm. Diameter at the superior mesenteric artery measures 3.0 cm. Diameter at the renal artery origins measures 2.4 cm. No inflammatory changes or periaortic fluid. Diameter of the distal aorta at the bifurcation measures 2.3 cm. Celiac: Minimal atherosclerotic changes at the origin of the celiac artery. SMA: Minimal atherosclerotic changes at the origin of the SMA. Renals: The bilateral main renal arteries are patent with no significant atherosclerotic changes. Small accessory left renal artery just anterior to the main left renal artery, as well as an additional accessory renal artery cephalad to the main renal artery. IMA: Inferior mesenteric arteries patent. 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 No dissection. Hypogastric artery originates from the aneurysm and remains patent. External iliac artery patent with no significant stenosis or occlusion. Proximal SFA and profunda femoris are patent. Left lower extremity: No dissection or aneurysm of the left common iliac artery. There appears to be stenosis of the  hypogastric artery origin secondary to calcified and soft plaque. Mild atherosclerotic changes of the left external iliac artery. Common femoral artery, proximal SFA and profunda femoris patent. Veins: Unremarkable appearance of the venous system. Review of the MIP images confirms the above findings. NON-VASCULAR Hepatobiliary: Hypodense lesion within the right liver, with only a single early arterial phase, in the region of the previously identified hemangioma. Lesion appears smaller on today's study measuring only approximately 2.5 cm. Unremarkable gallbladder. Pancreas: Unremarkable pancreas Spleen: Unremarkable spleen Adrenals/Urinary Tract: Unremarkable adrenal glands. Right: Nonobstructive stone at the superior collecting system of the right kidney measures 19 mm. No hydronephrosis. No perinephric stranding. Unremarkable course of the right ureter. Low-density lesion on the lateral cortex on image 110 compatible with a simple cyst. Small lesions at the posterior cortex are too small to characterize. Low-density lesion at the inferior cortex on image 123, compatible with simple cyst. Left: Nonobstructive stone at the anterior collecting system of the left kidney Unremarkable course of the left ureter. No perinephric stranding. No left-sided hydronephrosis. Low-density cyst at the superior cortex on image 92, simple cyst. Additional smaller low-density lesions on the anterior and lateral cortex are too small to characterize. Unremarkable appearance of the urinary bladder . Stomach/Bowel: Small hiatal hernia. Unremarkable stomach. Unremarkable small bowel with no abnormal distention. No focal wall thickening. No transition point or inflammatory changes. Normal appendix. Mild stool burden. No abnormal distention. No inflammatory changes. Mild colonic diverticular change. Lymphatic: No abdominal lymphadenopathy.  No inflammatory changes. Reproductive: Transverse diameter of the prostate measures 5.6 cm Other:  Bilateral fat containing inguinal hernia. Fat containing umbilical hernia. Musculoskeletal: No acute fracture. Degenerative changes of the lumbar spine. Mild degenerative changes of the hips. IMPRESSION: No acute vascular abnormality. 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 Diameter of the aorta at the SMA origin measures 3.0 cm. Irregular ulcerated plaque of the mid descending thoracic aorta, unchanged from the comparison CT. Diameter of the aorta at this level measures approximately 4.2 cm, with depth of the ulcer approximately 1.1 cm. No associated inflammatory changes. Additional irregular/ulcerated plaque of the infrarenal abdominal aorta at the level of the IMA. Aortic Atherosclerosis (ICD10-I70.0). Paraseptal and centrilobular emphysema.  Emphysema (ICD10-J43.9). Small nodule at the left lung apex. Given the apparent risk factors, noncontrast chest CT can be considered in 12 months.  This recommendation follows the consensus statement: Guidelines for Management of Incidental Pulmonary Nodules Detected on CT Images: From the Fleischner Society 2017; Radiology 2017; 284:228-243. Bilateral nonobstructing nephrolithiasis. Bilateral benign cysts of the kidneys. There are multiple bilateral kidney lesions which are too small to characterize. Small hiatal hernia. Re- demonstration of right liver lobe lesion, which is been previously characterized as hemangioma. Diverticular disease without evidence of acute diverticulitis. Single vessel coronary artery disease. Electronically Signed   By: Gilmer Mor D.O.   On: 03/24/2017 12:26   Assessment/Plan The patient is a 72 year old male with a past medical history of hypertension, hyperlipidemia, peripheral artery disease, tobacco abuse iliac and thoracic aortic aneurysms who was initially seen in our  office January 2018 for evaluation however was noncompliant in undergoing a scheduled CTA of the abdomen and pelvis and follow-up in our office - Stable  Unfortunately, the patient was discharged before this consultation was completed.  Our office will reach out to the patient on Monday to set up an appointment to be seen to continue care.  1. Thoracic Aneurysm - aneurysm size and plaque / ulcer distribution relatively stable when compared to CTA conducted in 2014.  Since the patient is asymptomatic and physical exam is unremarkable recommend yearly CTA of the chest.  If the patient's aneurysm increases in size to 6cm - repair may be considered.  If plaque / ulceration increases in size stenting may be considered.  I had a long conversation with the patient about the pathophysiology of aneurysms and the need for continued surveillance. He expresses understanding. 2. Visceral Aneurysm - aneurysm size and plaque / ulcer distribution relatively stable when compared to CTA conducted in 2014.  Since the patient is asymptomatic and physical exam is unremarkable recommend yearly CTA of the chest.  If the patient's aneurysm increases in size to 6cm - repair may be considered.  If plaque / ulceration increases in size stenting may be considered.  I had a long conversation with the patient about the pathophysiology of aneurysms and the need for continued surveillance. He expresses understanding. 3. Right iliac: Unfortunately, the patient was discharged before this consultation was completed.  Our office will reach out to the patient on Monday to set up an appointment to be seen to continue care. 4. Hyperlipdemia: Patient is on statin and aspirin. Encouraged good control as its slows the progression of atherosclerotic disease 5. Hypertension: Encouraged good control as its slows the progression of atherosclerotic and aneurysmal disease 6. Tobacco Abuse: We had a discussion for approximately 10 minutes regarding the  absolute need for smoking cessation due to the deleterious nature of tobacco on the vascular system. We discussed the tobacco use would diminish patency of any intervention, and likely significantly worsen progressio of disease. We discussed multiple agents for quitting including replacement therapy or medications to reduce cravings such as Chantix. The patient voices their understanding of the importance of smoking cessation.Encouraged good control as its slows the progression of atherosclerotic disease 7. PAD:  Discussed with Dr. Weldon Inches, PA-C  03/25/2017 4:44 PM  This note was created with Dragon medical transcription system.  Any error is purely unintentional.

## 2017-03-25 NOTE — Progress Notes (Signed)
Nuclear stress test was abnormal, revealing a mild defect in the mid inferior, apical inferior, and apex locations, consistent with scar without evidence of ischemia. LV EF decreased to 30-44%. Echocardiogram revealed normal left ventricular function with LVEF 55-65%, with mild to moderate MR.   The patient would like to go home to care for his disabled wife. I strongly advised the patient to follow-up with Dr. Darrold JunkerParaschos in 1 week as outpatient, at which time we may pursue scheduling outpatient cardiac catheterization.  Continue metoprolol, losartan, amlodipine, aspirin, rosuvastatin, and encourage patient to stop smoking.  The patient may be discharged from a cardiac perspective with close follow-up as outpatient.

## 2017-03-25 NOTE — Progress Notes (Signed)
Patient has no acute event overnight. Received 1 time dose of PRN 10mg  Hydralazine for hugh blood pressure. Patient denied chest pain, VSS, NSR and on heparin gtt. NPO at midnight

## 2017-03-25 NOTE — Progress Notes (Signed)
*  PRELIMINARY RESULTS* Echocardiogram 2D Echocardiogram has been performed.  Glenn Hill, Glenn Hill 03/25/2017, 8:26 AM

## 2017-03-25 NOTE — Progress Notes (Signed)
Patient discharged via wheelchair and private vehicle. 

## 2017-03-25 NOTE — Progress Notes (Signed)
[facility] Cardiology  SUBJECTIVE: The patient denies recurrence of chest pain. He denies shortness of breath. He reports feeling fine this morning, but does report feeling tired.   Vitals:   03/24/17 1805 03/24/17 1944 03/25/17 0027 03/25/17 0618  BP: (!) 150/78 137/70 (!) 178/86 (!) 145/58  Pulse: 71 76 66 65  Resp:  18  16  Temp:  98 F (36.7 C)  98.3 F (36.8 C)  TempSrc:  Oral  Oral  SpO2:  99%  95%  Weight:      Height:         Intake/Output Summary (Last 24 hours) at 03/25/2017 0853 Last data filed at 03/25/2017 0600 Gross per 24 hour  Intake 350.17 ml  Output 225 ml  Net 125.17 ml      PHYSICAL EXAM  General: Well developed, well nourished, in no acute distress HEENT:  Normocephalic and atramatic Neck:  No JVD.  Lungs: Normal effort of breathing on room air. Heart: HRRR . Normal S1 and S2 without gallops or murmurs.  Abdomen: Bowel sounds are positive Msk:  Back normal, patient lying supine in bed. Extremities: No clubbing, cyanosis or edema.   Neuro: Alert and oriented X 3. Psych:  Good affect, responds appropriately   LABS: Basic Metabolic Panel: Recent Labs    03/24/17 1040 03/25/17 0456  NA 140 138  K 4.0 3.7  CL 109 109  CO2 22 20*  GLUCOSE 119* 116*  BUN 23* 19  CREATININE 1.10 1.16  CALCIUM 8.9 8.9   Liver Function Tests: No results for input(s): AST, ALT, ALKPHOS, BILITOT, PROT, ALBUMIN in the last 72 hours. No results for input(s): LIPASE, AMYLASE in the last 72 hours. CBC: Recent Labs    03/24/17 1040 03/25/17 0456  WBC 6.1 9.2  HGB 13.6 13.0  HCT 39.1* 37.3*  MCV 86.2 85.8  PLT 221 210   Cardiac Enzymes: Recent Labs    03/24/17 1540 03/24/17 2200 03/25/17 0456  TROPONINI 0.34* 0.82* 0.75*   BNP: Invalid input(s): POCBNP D-Dimer: No results for input(s): DDIMER in the last 72 hours. Hemoglobin A1C: Recent Labs    03/24/17 1540  HGBA1C 5.6   Fasting Lipid Panel: Recent Labs    03/24/17 1040  CHOL 162  HDL 33*   LDLCALC 71  TRIG 289*  CHOLHDL 4.9   Thyroid Function Tests: Recent Labs    03/24/17 1540  TSH 1.949   Anemia Panel: No results for input(s): VITAMINB12, FOLATE, FERRITIN, TIBC, IRON, RETICCTPCT in the last 72 hours.  Ct Angio Chest Aorta W And/or Wo Contrast  Result Date: 03/24/2017 CLINICAL DATA:  72 year old male with a history of chest pain and hypertension. History of abdominal aortic aneurysm EXAM: CT ANGIOGRAPHY CHEST, ABDOMEN AND PELVIS TECHNIQUE: Multidetector CT imaging through the chest, abdomen and pelvis was performed using the standard protocol during bolus administration of intravenous contrast. Multiplanar reconstructed images and MIPs were obtained and reviewed to evaluate the vascular anatomy. CONTRAST:  ISOVUE-370 IOPAMIDOL (ISOVUE-370) INJECTION 76% COMPARISON:  08/02/2012, 09/12/2006, 06/06/2006 FINDINGS: CTA CHEST FINDINGS Cardiovascular: Heart: Cardiac diameter borderline enlarged. No pericardial fluid/ thickening. Coronary calcifications of the left anterior descending coronary artery. Minimal calcifications of the aortic valve. Aorta: Diameter of ascending thoracic aorta measures 4.4 cm. No dissection flap. Mild atherosclerotic changes of the aortic arch. Branch vessels are patent with 3 vessel aortic arch. Greatest diameter of the descending thoracic aorta above the hiatus measures 4.3 cm at site of aortic ulcer, projecting toward the anterior right. The ulcer  measures 1.4 cm at the base, with an estimated depth of 1.1 cm. Overall, there has not been significant change from the comparison CT of 08/02/2012. No associated inflammatory changes within the fat. No periaortic fluid. 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 No inflammatory changes at this site. No periaortic fluid. The diameter of the aorta at the aortic hiatus appears relatively unchanged to the CT  08/02/2012, however, no parasagittal images are available for comparison on the prior study. Pulmonary arteries: No central, lobar, segmental, or proximal subsegmental filling defects. Mediastinum/Nodes: No mediastinal adenopathy. Small lymph nodes are present. Unremarkable thoracic inlet. Unremarkable appearance of the thoracic esophagus. Lungs/Pleura: Paraseptal and centrilobular emphysema. No confluent airspace disease. No pleural effusion. No pneumothorax. No endotracheal or endobronchial debris. No bronchial wall thickening. 4 mm nodule at the left lung apex adjacent to the fissure. No pneumothorax. Musculoskeletal: No acute displaced fracture. Degenerative changes of the thoracic spine. Review of the MIP images confirms the above findings. CTA ABDOMEN AND PELVIS FINDINGS VASCULAR Aorta: Below the aortic hiatus, were the diameter measures greatest, the diameter of the aorta is relatively unchanged from the comparison CT. No dissection flap. No periaortic fluid. Similar appearance of irregular plaque/ early ulceration projecting anterior and to the left at the level of the inferior mesenteric artery origin. Appearance is unchanged from the comparison. Diameter at the celiac artery measures 3.4 cm. Diameter at the superior mesenteric artery measures 3.0 cm. Diameter at the renal artery origins measures 2.4 cm. No inflammatory changes or periaortic fluid. Diameter of the distal aorta at the bifurcation measures 2.3 cm. Celiac: Minimal atherosclerotic changes at the origin of the celiac artery. SMA: Minimal atherosclerotic changes at the origin of the SMA. Renals: The bilateral main renal arteries are patent with no significant atherosclerotic changes. Small accessory left renal artery just anterior to the main left renal artery, as well as an additional accessory renal artery cephalad to the main renal artery. IMA: Inferior mesenteric arteries patent. Right lower extremity: Aneurysm of the right common iliac  artery measuring 2.8 cm, slightly larger than the comparison CT of 2014. No dissection. Hypogastric artery originates from the aneurysm and remains patent. External iliac artery patent with no significant stenosis or occlusion. Proximal SFA and profunda femoris are patent. Left lower extremity: No dissection or aneurysm of the left common iliac artery. There appears to be stenosis of the hypogastric artery origin secondary to calcified and soft plaque. Mild atherosclerotic changes of the left external iliac artery. Common femoral artery, proximal SFA and profunda femoris patent. Veins: Unremarkable appearance of the venous system. Review of the MIP images confirms the above findings. NON-VASCULAR Hepatobiliary: Hypodense lesion within the right liver, with only a single early arterial phase, in the region of the previously identified hemangioma. Lesion appears smaller on today's study measuring only approximately 2.5 cm. Unremarkable gallbladder. Pancreas: Unremarkable pancreas Spleen: Unremarkable spleen Adrenals/Urinary Tract: Unremarkable adrenal glands. Right: Nonobstructive stone at the superior collecting system of the right kidney measures 19 mm. No hydronephrosis. No perinephric stranding. Unremarkable course of the right ureter. Low-density lesion on the lateral cortex on image 110 compatible with a simple cyst. Small lesions at the posterior cortex are too small to characterize. Low-density lesion at the inferior cortex on image 123, compatible with simple cyst. Left: Nonobstructive stone at the anterior collecting system of the left kidney Unremarkable course of the left ureter. No perinephric stranding. No left-sided  hydronephrosis. Low-density cyst at the superior cortex on image 92, simple cyst. Additional smaller low-density lesions on the anterior and lateral cortex are too small to characterize. Unremarkable appearance of the urinary bladder . Stomach/Bowel: Small hiatal hernia. Unremarkable  stomach. Unremarkable small bowel with no abnormal distention. No focal wall thickening. No transition point or inflammatory changes. Normal appendix. Mild stool burden. No abnormal distention. No inflammatory changes. Mild colonic diverticular change. Lymphatic: No abdominal lymphadenopathy.  No inflammatory changes. Reproductive: Transverse diameter of the prostate measures 5.6 cm Other: Bilateral fat containing inguinal hernia. Fat containing umbilical hernia. Musculoskeletal: No acute fracture. Degenerative changes of the lumbar spine. Mild degenerative changes of the hips. IMPRESSION: No acute vascular abnormality. 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 Diameter of the aorta at the SMA origin measures 3.0 cm. Irregular ulcerated plaque of the mid descending thoracic aorta, unchanged from the comparison CT. Diameter of the aorta at this level measures approximately 4.2 cm, with depth of the ulcer approximately 1.1 cm. No associated inflammatory changes. Additional irregular/ulcerated plaque of the infrarenal abdominal aorta at the level of the IMA. Aortic Atherosclerosis (ICD10-I70.0). Paraseptal and centrilobular emphysema.  Emphysema (ICD10-J43.9). Small nodule at the left lung apex. Given the apparent risk factors, noncontrast chest CT can be considered in 12 months. This recommendation follows the consensus statement: Guidelines for Management of Incidental Pulmonary Nodules Detected on CT Images: From the Fleischner Society 2017; Radiology 2017; 284:228-243. Bilateral nonobstructing nephrolithiasis. Bilateral benign cysts of the kidneys. There are multiple bilateral kidney lesions which are too small to characterize. Small hiatal hernia. Re- demonstration of right liver lobe lesion, which is been previously characterized as hemangioma.  Diverticular disease without evidence of acute diverticulitis. Single vessel coronary artery disease. Electronically Signed   By: Gilmer MorJaime  Wagner D.O.   On: 03/24/2017 12:26   Ct Angio Abd/pel W And/or Wo Contrast  Result Date: 03/24/2017 CLINICAL DATA:  72 year old male with a history of chest pain and hypertension. History of abdominal aortic aneurysm EXAM: CT ANGIOGRAPHY CHEST, ABDOMEN AND PELVIS TECHNIQUE: Multidetector CT imaging through the chest, abdomen and pelvis was performed using the standard protocol during bolus administration of intravenous contrast. Multiplanar reconstructed images and MIPs were obtained and reviewed to evaluate the vascular anatomy. CONTRAST:  100mL ISOVUE-370 IOPAMIDOL (ISOVUE-370) INJECTION 76% COMPARISON:  08/02/2012, 09/12/2006, 06/06/2006 FINDINGS: CTA CHEST FINDINGS Cardiovascular: Heart: Cardiac diameter borderline enlarged. No pericardial fluid/ thickening. Coronary calcifications of the left anterior descending coronary artery. Minimal calcifications of the aortic valve. Aorta: Diameter of ascending thoracic aorta measures 4.4 cm. No dissection flap. Mild atherosclerotic changes of the aortic arch. Branch vessels are patent with 3 vessel aortic arch. Greatest diameter of the descending thoracic aorta above the hiatus measures 4.3 cm at site of aortic ulcer, projecting toward the anterior right. The ulcer measures 1.4 cm at the base, with an estimated depth of 1.1 cm. Overall, there has not been significant change from the comparison CT of 08/02/2012. No associated inflammatory changes within the fat. No periaortic fluid. 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 No inflammatory changes at this site. No periaortic fluid. The diameter of the aorta at the aortic hiatus appears relatively unchanged to the CT 08/02/2012, however, no parasagittal images are available for  comparison on the prior study.  Pulmonary arteries: No central, lobar, segmental, or proximal subsegmental filling defects. Mediastinum/Nodes: No mediastinal adenopathy. Small lymph nodes are present. Unremarkable thoracic inlet. Unremarkable appearance of the thoracic esophagus. Lungs/Pleura: Paraseptal and centrilobular emphysema. No confluent airspace disease. No pleural effusion. No pneumothorax. No endotracheal or endobronchial debris. No bronchial wall thickening. 4 mm nodule at the left lung apex adjacent to the fissure. No pneumothorax. Musculoskeletal: No acute displaced fracture. Degenerative changes of the thoracic spine. Review of the MIP images confirms the above findings. CTA ABDOMEN AND PELVIS FINDINGS VASCULAR Aorta: Below the aortic hiatus, were the diameter measures greatest, the diameter of the aorta is relatively unchanged from the comparison CT. No dissection flap. No periaortic fluid. Similar appearance of irregular plaque/ early ulceration projecting anterior and to the left at the level of the inferior mesenteric artery origin. Appearance is unchanged from the comparison. Diameter at the celiac artery measures 3.4 cm. Diameter at the superior mesenteric artery measures 3.0 cm. Diameter at the renal artery origins measures 2.4 cm. No inflammatory changes or periaortic fluid. Diameter of the distal aorta at the bifurcation measures 2.3 cm. Celiac: Minimal atherosclerotic changes at the origin of the celiac artery. SMA: Minimal atherosclerotic changes at the origin of the SMA. Renals: The bilateral main renal arteries are patent with no significant atherosclerotic changes. Small accessory left renal artery just anterior to the main left renal artery, as well as an additional accessory renal artery cephalad to the main renal artery. IMA: Inferior mesenteric arteries patent. Right lower extremity: Aneurysm of the right common iliac artery measuring 2.8 cm, slightly larger than the comparison CT of  2014. No dissection. Hypogastric artery originates from the aneurysm and remains patent. External iliac artery patent with no significant stenosis or occlusion. Proximal SFA and profunda femoris are patent. Left lower extremity: No dissection or aneurysm of the left common iliac artery. There appears to be stenosis of the hypogastric artery origin secondary to calcified and soft plaque. Mild atherosclerotic changes of the left external iliac artery. Common femoral artery, proximal SFA and profunda femoris patent. Veins: Unremarkable appearance of the venous system. Review of the MIP images confirms the above findings. NON-VASCULAR Hepatobiliary: Hypodense lesion within the right liver, with only a single early arterial phase, in the region of the previously identified hemangioma. Lesion appears smaller on today's study measuring only approximately 2.5 cm. Unremarkable gallbladder. Pancreas: Unremarkable pancreas Spleen: Unremarkable spleen Adrenals/Urinary Tract: Unremarkable adrenal glands. Right: Nonobstructive stone at the superior collecting system of the right kidney measures 19 mm. No hydronephrosis. No perinephric stranding. Unremarkable course of the right ureter. Low-density lesion on the lateral cortex on image 110 compatible with a simple cyst. Small lesions at the posterior cortex are too small to characterize. Low-density lesion at the inferior cortex on image 123, compatible with simple cyst. Left: Nonobstructive stone at the anterior collecting system of the left kidney Unremarkable course of the left ureter. No perinephric stranding. No left-sided hydronephrosis. Low-density cyst at the superior cortex on image 92, simple cyst. Additional smaller low-density lesions on the anterior and lateral cortex are too small to characterize. Unremarkable appearance of the urinary bladder . Stomach/Bowel: Small hiatal hernia. Unremarkable stomach. Unremarkable small bowel with no abnormal distention. No focal  wall thickening. No transition point or inflammatory changes. Normal appendix. Mild stool burden. No abnormal distention. No inflammatory changes. Mild colonic diverticular change. Lymphatic: No abdominal lymphadenopathy.  No inflammatory changes. Reproductive: Transverse diameter of the prostate measures 5.6 cm Other: Bilateral fat containing inguinal hernia.  Fat containing umbilical hernia. Musculoskeletal: No acute fracture. Degenerative changes of the lumbar spine. Mild degenerative changes of the hips. IMPRESSION: No acute vascular abnormality. 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 Diameter of the aorta at the SMA origin measures 3.0 cm. Irregular ulcerated plaque of the mid descending thoracic aorta, unchanged from the comparison CT. Diameter of the aorta at this level measures approximately 4.2 cm, with depth of the ulcer approximately 1.1 cm. No associated inflammatory changes. Additional irregular/ulcerated plaque of the infrarenal abdominal aorta at the level of the IMA. Aortic Atherosclerosis (ICD10-I70.0). Paraseptal and centrilobular emphysema.  Emphysema (ICD10-J43.9). Small nodule at the left lung apex. Given the apparent risk factors, noncontrast chest CT can be considered in 12 months. This recommendation follows the consensus statement: Guidelines for Management of Incidental Pulmonary Nodules Detected on CT Images: From the Fleischner Society 2017; Radiology 2017; 284:228-243. Bilateral nonobstructing nephrolithiasis. Bilateral benign cysts of the kidneys. There are multiple bilateral kidney lesions which are too small to characterize. Small hiatal hernia. Re- demonstration of right liver lobe lesion, which is been previously characterized as hemangioma. Diverticular disease without evidence of acute diverticulitis. Single  vessel coronary artery disease. Electronically Signed   By: Gilmer Mor D.O.   On: 03/24/2017 12:26     Echo Pending  TELEMETRY: Sinus rhythm  ASSESSMENT AND PLAN:  Active Problems:   Hypertensive urgency    1.  Chest pain with borderline elevated troponin of 0.09, 0.34, 0.82, followed by 0.75, without recurrence of chest pain with nonspecific T wave abnormalities on ECG.  The patient is currently on heparin drip.  The patient has no known history of coronary artery disease and no prior cardiac catheterizations. 2.  Borderline elevated troponin, may be secondary to ACS versus accelerated hypertension. 3.  Essential hypertension, under better control now 4.  Hyperlipidemia, on statin 5.  History of AAA  Recommendations: 1. We discussed with the patient his elevated troponin and the options for further evaluation including functional study and cardiac catheterization. The patient stated he did not prefer to undergo a cardiac catheterization and would rather do a stress test. I will order a nuclear stress test for this morning. If it is negative, the patient may be discharged from a cardiac perspective with close follow-up with Dr. Darrold Junker as outpatient in about 1 week. 2. Continue current medications. 3. Review 2D echocardiogram 4. Further recommendations pending results of functional study and echocardiogram.   Leanora Ivanoff, PA-C 03/25/2017 8:53 AM

## 2017-03-28 ENCOUNTER — Emergency Department: Payer: Medicare HMO

## 2017-03-28 ENCOUNTER — Other Ambulatory Visit: Payer: Self-pay

## 2017-03-28 ENCOUNTER — Encounter: Payer: Self-pay | Admitting: Emergency Medicine

## 2017-03-28 ENCOUNTER — Emergency Department
Admission: EM | Admit: 2017-03-28 | Discharge: 2017-03-28 | Discharge status: Against Medical Advice | Payer: Medicare HMO | Attending: Emergency Medicine | Admitting: Emergency Medicine

## 2017-03-28 DIAGNOSIS — I10 Essential (primary) hypertension: Secondary | ICD-10-CM | POA: Diagnosis not present

## 2017-03-28 DIAGNOSIS — R7989 Other specified abnormal findings of blood chemistry: Secondary | ICD-10-CM | POA: Diagnosis not present

## 2017-03-28 DIAGNOSIS — Z5329 Procedure and treatment not carried out because of patient's decision for other reasons: Secondary | ICD-10-CM | POA: Diagnosis not present

## 2017-03-28 DIAGNOSIS — F17210 Nicotine dependence, cigarettes, uncomplicated: Secondary | ICD-10-CM | POA: Insufficient documentation

## 2017-03-28 DIAGNOSIS — M79602 Pain in left arm: Secondary | ICD-10-CM | POA: Insufficient documentation

## 2017-03-28 LAB — CBC
HCT: 37.9 % — ABNORMAL LOW (ref 40.0–52.0)
Hemoglobin: 13.1 g/dL (ref 13.0–18.0)
MCH: 29.3 pg (ref 26.0–34.0)
MCHC: 34.6 g/dL (ref 32.0–36.0)
MCV: 84.9 fL (ref 80.0–100.0)
PLATELETS: 203 10*3/uL (ref 150–440)
RBC: 4.46 MIL/uL (ref 4.40–5.90)
RDW: 15 % — ABNORMAL HIGH (ref 11.5–14.5)
WBC: 6.7 10*3/uL (ref 3.8–10.6)

## 2017-03-28 LAB — BASIC METABOLIC PANEL
Anion gap: 7 (ref 5–15)
BUN: 24 mg/dL — AB (ref 6–20)
CALCIUM: 9 mg/dL (ref 8.9–10.3)
CHLORIDE: 111 mmol/L (ref 101–111)
CO2: 22 mmol/L (ref 22–32)
CREATININE: 1.13 mg/dL (ref 0.61–1.24)
GFR calc non Af Amer: >60 mL/min (ref >60)
Glucose, Bld: 116 mg/dL — ABNORMAL HIGH (ref 65–99)
Potassium: 3.6 mmol/L (ref 3.5–5.1)
SODIUM: 140 mmol/L (ref 135–145)

## 2017-03-28 LAB — TROPONIN I
TROPONIN I: 0.24 ng/mL — AB (ref <0.03)
Troponin I: 0.26 ng/mL (ref <0.03)

## 2017-03-28 MED ORDER — ASPIRIN 81 MG PO CHEW
324.0000 mg | CHEWABLE_TABLET | Freq: Once | ORAL | Status: AC
  Administered 2017-03-28: 324 mg via ORAL
  Filled 2017-03-28: qty 4

## 2017-03-28 NOTE — ED Triage Notes (Signed)
Pt started with pain in left arm that then moved to right neck this morning. 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 No other symptoms such as SHOB, diaphoresis, or nausea.

## 2017-03-28 NOTE — ED Notes (Signed)
Per dr Mayford Knifewilliams pt left AMA and verbalized understanding of risks; discharge note was signed as at time RN did not know pt was leaving AMA

## 2017-03-28 NOTE — ED Provider Notes (Signed)
[facility] Emergency Department Provider Note       Time seen: ----------------------------------------- 11:12 AM on 03/28/2017 -----------------------------------------   I have reviewed the triage vital signs and the nursing notes.  HISTORY   Chief Complaint Arm Pain    HPI Glenn Hill is a 72 y.o. male with a history of AAA, hypertension, hyperlipidemia who presents to the ED for left arm pain and some right-sided neck pain that started this morning.  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  He has not had any other associated symptoms such as shortness of breath, diaphoresis or nausea.  He was recently in the hospital for chest pain and was found to have an end STEMI.  He had an abnormal nuclear stress test during his hospital stay.  Past Medical History:  Diagnosis Date  . AAA (abdominal aortic aneurysm) (HCC)   . BPH (benign prostatic hyperplasia)   . Elevated PSA   . HLD (hyperlipidemia)   . HTN (hypertension)   . Kidney stones     Patient Active Problem List   Diagnosis Date Noted  . Hypertensive urgency 03/24/2017  . BPH (benign prostatic hyperplasia) 08/26/2016  . Tobacco abuse 08/26/2016  . Essential hypertension, benign 04/06/2016  . Hyperlipidemia 04/06/2016  . Thoracic aortic aneurysm (HCC) 04/06/2016  . 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 Leg circulation Surgery Left     Allergies Sulfa antibiotics  Social History Social History   Tobacco Use  . Smoking status: Current Every Day Smoker    Packs/day: 0.50    Years: 55.00    Pack years: 27.50    Types: Cigarettes  . Smokeless tobacco: Never Used  Substance Use Topics  . Alcohol use: No    Alcohol/week: 0.0 oz  . Drug use: No    Review of Systems Constitutional: Negative for fever. Eyes: Negative for vision changes ENT:  Negative for congestion, sore  throat Cardiovascular: Negative for chest pain Respiratory: Negative for shortness of breath. Gastrointestinal: Negative for abdominal pain, vomiting and diarrhea. Genitourinary: Negative for dysuria. Musculoskeletal: positive for left arm pain Skin: Negative for rash. Neurological: Negative for headaches, focal weakness or numbness.  All systems negative/normal/unremarkable except as stated in the HPI  ____________________________________________   PHYSICAL EXAM:  VITAL SIGNS: ED Triage Vitals  Enc Vitals Group     BP 03/28/17 1108 (!) 183/83     Pulse Rate 03/28/17 1106 72     Resp 03/28/17 1106 18     Temp 03/28/17 1106 97.9 F (36.6 C)     Temp Source 03/28/17 1106 Oral     SpO2 03/28/17 1106 99 %     Weight 03/28/17 1106 190 lb (86.2 kg)     Height 03/28/17 1106 5\' 8"  (1.727 m)     Head Circumference --      Peak Flow --      Pain Score 03/28/17 1106 1     Pain Loc --      Pain Edu? --      Excl. in GC? --     Constitutional: Alert and oriented. Well appearing and in no distress. Eyes: Conjunctivae are normal. Normal extraocular movements. ENT   Head: Normocephalic and atraumatic.   Nose: No congestion/rhinnorhea.   Mouth/Throat: Mucous membranes are moist.   Neck: No stridor. Cardiovascular: Normal rate, regular rhythm. No murmurs, rubs, or gallops. Respiratory: Normal respiratory  effort without tachypnea nor retractions. Breath sounds are clear and equal bilaterally. No wheezes/rales/rhonchi. Gastrointestinal: Soft and nontender. Normal bowel sounds Musculoskeletal: Nontender with normal range of motion in extremities. No lower extremity tenderness nor edema. Neurologic:  Normal speech and language. No gross focal neurologic deficits are appreciated.  Skin:  Skin is warm, dry and intact. No rash noted. Psychiatric: Mood and affect are normal. Speech and behavior are normal.  ____________________________________________  EKG: Interpreted by me.   Sinus rhythm the rate of 70 bpm, left axis deviation, LVH, normal QT  ____________________________________________  ED COURSE:  As part of my medical decision making, I reviewed the following data within the electronic MEDICAL RECORD NUMBER History obtained from family if available, nursing notes, old chart and ekg, as well as notes from prior ED visits. Patient presented for left arm pain of uncertain etiology, we will assess with labs and imaging as indicated at this time.   Procedures ____________________________________________   LABS (pertinent positives/negatives)  Labs Reviewed  BASIC METABOLIC PANEL - Abnormal; Notable for the following components:      Result Value   Glucose, Bld 116 (*)    BUN 24 (*)    All other components within normal limits  CBC - Abnormal; Notable for the following components:   HCT 37.9 (*)    RDW 15.0 (*)    All other components within normal limits  TROPONIN I - Abnormal; Notable for the following components:   Troponin I 0.24 (*)    All other components within normal limits  TROPONIN I - Abnormal; Notable for the following components:   Troponin I 0.26 (*)    All other components within normal limits    RADIOLOGY  Chest x-ray IMPRESSION: No acute cardiopulmonary process. ____________________________________________  DIFFERENTIAL DIAGNOSIS   Unstable angina, MI, PE, pneumothorax, vascular disease,  FINAL ASSESSMENT AND PLAN  Arm pain   Plan: Patient had presented for left arm pain.  He has not had any chest pain per se.  His main concern was that this may be related to the chest pain that he had recently.  His cardiac enzymes are improved from when he was in the hospital but still abnormal.  I have advised that he must stay and have a cardiac catheterization but he has declined.  He states his wife is on dialysis and he has to go home to take care of her.  He was given a dose of aspirin and he states he will follow-up with his  cardiologist as scheduled.   Emily Filbert, MD   Note: This note was generated in part or whole with voice recognition software. Voice recognition is usually quite accurate but there are transcription errors that can and very often do occur. I apologize for any typographical errors that were not detected and corrected.     Emily Filbert, MD 03/28/17 1329

## 2017-03-29 ENCOUNTER — Ambulatory Visit (INDEPENDENT_AMBULATORY_CARE_PROVIDER_SITE_OTHER): Payer: Medicare HMO | Admitting: Internal Medicine

## 2017-03-29 ENCOUNTER — Encounter: Payer: Self-pay | Admitting: Internal Medicine

## 2017-03-29 VITALS — BP 142/84 | HR 86 | Temp 97.7°F | Resp 18 | Ht 67.0 in | Wt 192.8 lb

## 2017-03-29 DIAGNOSIS — I712 Thoracic aortic aneurysm, without rupture, unspecified: Secondary | ICD-10-CM

## 2017-03-29 DIAGNOSIS — Z716 Tobacco abuse counseling: Secondary | ICD-10-CM | POA: Diagnosis not present

## 2017-03-29 DIAGNOSIS — R350 Frequency of micturition: Secondary | ICD-10-CM

## 2017-03-29 DIAGNOSIS — M4802 Spinal stenosis, cervical region: Secondary | ICD-10-CM | POA: Diagnosis not present

## 2017-03-29 DIAGNOSIS — Z720 Tobacco use: Secondary | ICD-10-CM

## 2017-03-29 DIAGNOSIS — J432 Centrilobular emphysema: Secondary | ICD-10-CM

## 2017-03-29 DIAGNOSIS — E785 Hyperlipidemia, unspecified: Secondary | ICD-10-CM | POA: Diagnosis not present

## 2017-03-29 DIAGNOSIS — I2510 Atherosclerotic heart disease of native coronary artery without angina pectoris: Secondary | ICD-10-CM

## 2017-03-29 DIAGNOSIS — N401 Enlarged prostate with lower urinary tract symptoms: Secondary | ICD-10-CM | POA: Diagnosis not present

## 2017-03-29 DIAGNOSIS — K219 Gastro-esophageal reflux disease without esophagitis: Secondary | ICD-10-CM

## 2017-03-29 DIAGNOSIS — I10 Essential (primary) hypertension: Secondary | ICD-10-CM | POA: Diagnosis not present

## 2017-03-29 DIAGNOSIS — I723 Aneurysm of iliac artery: Secondary | ICD-10-CM

## 2017-03-29 MED ORDER — ALBUTEROL SULFATE HFA 108 (90 BASE) MCG/ACT IN AERS: 1.0000 | INHALATION_SPRAY | Freq: Four times a day (QID) | RESPIRATORY_TRACT | 11 refills | Status: AC | PRN

## 2017-03-29 MED ORDER — LOSARTAN POTASSIUM 100 MG PO TABS: 100.0000 mg | ORAL_TABLET | Freq: Every day | ORAL | 0 refills | Status: AC

## 2017-03-29 MED ORDER — BUPROPION HCL ER (XL) 300 MG PO TB24: 300.0000 mg | ORAL_TABLET | Freq: Every day | ORAL | 5 refills | Status: AC

## 2017-03-29 MED ORDER — OMEPRAZOLE 20 MG PO CPDR: 20.0000 mg | DELAYED_RELEASE_CAPSULE | Freq: Every day | ORAL | 1 refills | Status: AC

## 2017-03-29 MED ORDER — LOSARTAN POTASSIUM 100 MG PO TABS: 100.0000 mg | ORAL_TABLET | Freq: Every day | ORAL | 0 refills | Status: DC

## 2017-03-29 MED ORDER — BUDESONIDE-FORMOTEROL FUMARATE 160-4.5 MCG/ACT IN AERO: 2.0000 | INHALATION_SPRAY | Freq: Two times a day (BID) | RESPIRATORY_TRACT | 11 refills | Status: AC

## 2017-03-29 NOTE — Progress Notes (Signed)
Chief Complaint  Patient presents with  . Hospitalization Follow-up   HFU  1. Chest pain 1/3 discharged 1/4 and went back 03/28/17 his troponins were elevated and evidence of CAD on CT scan and prior MI on stress test NM. He also had echo. He has f/u with cardiology upcoming and vascular Dr. Lucky Cowboy. No chest pain today but did radiate to left arm and right shoulder  2. He is still smoking and wants to quit. Disc increase Wellbutrin dose today  3. Reviewed CT chest with pt and given copy  4. HTN uncontrolled on Losartan 50, Toprol XL 50 mg qd    Review of Systems  Constitutional: Negative for weight loss.  HENT: Negative for hearing loss.   Eyes:       +wears glasses   Respiratory: Positive for cough and shortness of breath.        Chronic cough Sob with exertion   Cardiovascular: Negative for chest pain.  Gastrointestinal: Positive for blood in stool and heartburn.       Intermittent blood in stool with straining  Musculoskeletal:       H/o neck pain none today no pain with ROM  Skin: Negative for rash.  Neurological: Negative for headaches.  Psychiatric/Behavioral: Negative for memory loss.       +stress with his health and wifes health   Past Medical History:  Diagnosis Date  . AAA (abdominal aortic aneurysm) ([place])   . BPH (benign prostatic hyperplasia)   . COPD (chronic obstructive pulmonary disease) ([place])   . Elevated PSA   . HLD (hyperlipidemia)   . HTN (hypertension)   . Kidney stones    Past Surgical History:  Procedure Laterality Date  . Leg circulation Surgery Left    Family History  Problem Relation Age of Onset  . Dementia Father   . Heart attack Father   . Alzheimer's disease Mother   . CAD Brother    Social History   Socioeconomic History  . Marital status: Married    Spouse name: Not on file  . Number of children: Not on file  . Years of education: Not on file  . Highest education level: Not on file  Social Needs  . Financial resource strain: Not on  file  . Food insecurity - worry: Not on file  . Food insecurity - inability: Not on file  . Transportation needs - medical: Not on file  . Transportation needs - non-medical: Not on file  Occupational History  . Not on file  Tobacco Use  . Smoking status: Current Every Day Smoker    Packs/day: 0.50    Years: 55.00    Pack years: 27.50    Types: Cigarettes  . Smokeless tobacco: Never Used  Substance and Sexual Activity  . Alcohol use: No    Alcohol/week: 0.0 oz  . Drug use: No  . Sexual activity: No  Other Topics Concern  . Not on file  Social History Narrative   Independent at baseline. Works in [employer]   Current Meds  Medication Sig  . amLODipine (NORVASC) 5 MG tablet Take 1 tablet (5 mg total) by mouth daily.  Marland Kitchen aspirin EC 81 MG tablet Take 81 mg by mouth daily.  Marland Kitchen losartan (COZAAR) 100 MG tablet Take 1 tablet (100 mg total) by mouth daily.  . metoprolol succinate (TOPROL-XL) 50 MG 24 hr tablet Take 1 tablet (50 mg total) by mouth daily. Take with or immediately following a meal.  . rosuvastatin (  CRESTOR) 20 MG tablet Take 1 tablet (20 mg total) by mouth daily.  . tamsulosin (FLOMAX) 0.4 MG CAPS capsule Take 0.4 mg by mouth daily.  . [DISCONTINUED] buPROPion (WELLBUTRIN SR) 150 MG 12 hr tablet Take 150 mg by mouth daily.  . [DISCONTINUED] losartan (COZAAR) 100 MG tablet Take 1 tablet (100 mg total) by mouth daily.  . [DISCONTINUED] losartan (COZAAR) 50 MG tablet Take 1 tablet (50 mg total) by mouth daily.   Allergies  Allergen Reactions  . Sulfa Antibiotics     swelling   Recent Results (from the past 2160 hour(s))  CBC     Status: Abnormal   Collection Time: 03/24/17 10:40 AM  Result Value Ref Range   WBC 6.1 3.8 - 10.6 K/uL   RBC 4.53 4.40 - 5.90 MIL/uL   Hemoglobin 13.6 13.0 - 18.0 g/dL   HCT 39.1 (L) 40.0 - 52.0 %   MCV 86.2 80.0 - 100.0 fL   MCH 30.1 26.0 - 34.0 pg   MCHC 34.9 32.0 - 36.0 g/dL   RDW 15.7 (H) 11.5 - 14.5 %   Platelets 221 150 - 440  K/uL    Comment: Performed at [facility], [address] [place], [place] [address]  Basic metabolic panel     Status: Abnormal   Collection Time: 03/24/17 10:40 AM  Result Value Ref Range   Sodium 140 135 - 145 mmol/L   Potassium 4.0 3.5 - 5.1 mmol/L   Chloride 109 101 - 111 mmol/L   CO2 22 22 - 32 mmol/L   Glucose, Bld 119 (H) 65 - 99 mg/dL   BUN 23 (H) 6 - 20 mg/dL   Creatinine, Ser 1.10 0.61 - 1.24 mg/dL   Calcium 8.9 8.9 - 10.3 mg/dL   GFR calc non Af Amer >60 >60 mL/min   GFR calc Af Amer >60 >60 mL/min    Comment: (NOTE) The eGFR has been calculated using the CKD EPI equation. This calculation has not been validated in all clinical situations. eGFR's persistently <60 mL/min signify possible Chronic Kidney Disease.    Anion gap 9 5 - 15    Comment: Performed at [facility], [address] [place], [place] [address]  Troponin I     Status: Abnormal   Collection Time: 03/24/17 10:40 AM  Result Value Ref Range   Troponin I 0.09 (HH) <0.03 ng/mL    Comment: CRITICAL RESULT CALLED TO, READ BACK BY AND VERIFIED WITH JERI THOMPSON 03/24/17 1120 KLW Performed at [facility], [address] [place], [place] [address]   APTT     Status: Abnormal   Collection Time: 03/24/17 10:40 AM  Result Value Ref Range   aPTT 49 (H) 24 - 36 seconds    Comment:        IF BASELINE aPTT IS ELEVATED, SUGGEST PATIENT RISK ASSESSMENT BE USED TO DETERMINE APPROPRIATE ANTICOAGULANT THERAPY. Performed at [facility], [address] [place], [place] [address]   Protime-INR     Status: None   Collection Time: 03/24/17 10:40 AM  Result Value Ref Range   Prothrombin Time 13.8 11.4 - 15.2 seconds   INR 1.07     Comment: Performed at [facility], [address] [place], [place] [address]  Lipid panel     Status: Abnormal   Collection Time: 03/24/17 10:40 AM  Result Value Ref Range   Cholesterol 162 0 - 200 mg/dL   Triglycerides 289 (H)  <150 mg/dL  HDL 33 (L) >40 mg/dL   Total CHOL/HDL Ratio 4.9 RATIO   VLDL 58 (H) 0 - 40 mg/dL   LDL Cholesterol 71 0 - 99 mg/dL    Comment:        Total Cholesterol/HDL:CHD Risk Coronary Heart Disease Risk Table                     Men   Women  1/2 Average Risk   3.4   3.3  Average Risk       5.0   4.4  2 X Average Risk   9.6   7.1  3 X Average Risk  23.4   11.0        Use the calculated Patient Ratio above and the CHD Risk Table to determine the patient's CHD Risk.        ATP III CLASSIFICATION (LDL):  <100     mg/dL   Optimal  100-129  mg/dL   Near or Above                    Optimal  130-159  mg/dL   Borderline  160-189  mg/dL   High  >190     mg/dL   Very High Performed at [facility], [address] [place], [place] [address]   TSH     Status: None   Collection Time: 03/24/17  3:40 PM  Result Value Ref Range   TSH 1.949 0.350 - 4.500 uIU/mL    Comment: Performed by a 3rd Generation assay with a functional sensitivity of <=0.01 uIU/mL. Performed at [facility], [address] [place], [place] [address]   Hemoglobin A1c     Status: None   Collection Time: 03/24/17  3:40 PM  Result Value Ref Range   Hgb A1c MFr Bld 5.6 4.8 - 5.6 %    Comment: (NOTE) Pre diabetes:          5.7%-6.4% Diabetes:              >6.4% Glycemic control for   <7.0% adults with diabetes    Mean Plasma Glucose 114.02 mg/dL    Comment: Performed at [facility] [address] [place], [place] [address]  Troponin I     Status: Abnormal   Collection Time: 03/24/17  3:40 PM  Result Value Ref Range   Troponin I 0.34 (HH) <0.03 ng/mL    Comment: CRITICAL VALUE NOTED. VALUE IS CONSISTENT WITH PREVIOUSLY REPORTED/CALLED VALUE SNJ Performed at [facility], [address] [place], [place] [address]   Troponin I     Status: Abnormal   Collection Time: 03/24/17 10:00 PM  Result Value Ref Range   Troponin I 0.82 (HH) <0.03 ng/mL    Comment: CRITICAL VALUE  NOTED. VALUE IS CONSISTENT WITH PREVIOUSLY REPORTED/CALLED VALUE ALV Performed at [facility], [address] [place], [place] [address]   Heparin level (unfractionated)     Status: None   Collection Time: 03/25/17 12:50 AM  Result Value Ref Range   Heparin Unfractionated 0.41 0.30 - 0.70 IU/mL    Comment:        IF HEPARIN RESULTS ARE BELOW EXPECTED VALUES, AND PATIENT DOSAGE HAS BEEN CONFIRMED, SUGGEST FOLLOW UP TESTING OF ANTITHROMBIN III LEVELS. Performed at [facility], [address] [place], [place] [address]   Troponin I     Status: Abnormal   Collection Time: 03/25/17  4:56 AM  Result Value Ref  Range   Troponin I 0.75 (HH) <0.03 ng/mL    Comment: CRITICAL VALUE NOTED. VALUE IS CONSISTENT WITH PREVIOUSLY REPORTED/CALLED VALUE...[facility] Performed at [facility], [address] [place], [place] [address]   Basic metabolic panel     Status: Abnormal   Collection Time: 03/25/17  4:56 AM  Result Value Ref Range   Sodium 138 135 - 145 mmol/L   Potassium 3.7 3.5 - 5.1 mmol/L   Chloride 109 101 - 111 mmol/L   CO2 20 (L) 22 - 32 mmol/L   Glucose, Bld 116 (H) 65 - 99 mg/dL   BUN 19 6 - 20 mg/dL   Creatinine, Ser 1.16 0.61 - 1.24 mg/dL   Calcium 8.9 8.9 - 10.3 mg/dL   GFR calc non Af Amer >60 >60 mL/min   GFR calc Af Amer >60 >60 mL/min    Comment: (NOTE) The eGFR has been calculated using the CKD EPI equation. This calculation has not been validated in all clinical situations. eGFR's persistently <60 mL/min signify possible Chronic Kidney Disease.    Anion gap 9 5 - 15    Comment: Performed at [facility], [address] [place], [place] [address]  CBC     Status: Abnormal   Collection Time: 03/25/17  4:56 AM  Result Value Ref Range   WBC 9.2 3.8 - 10.6 K/uL   RBC 4.35 (L) 4.40 - 5.90 MIL/uL   Hemoglobin 13.0 13.0 - 18.0 g/dL   HCT 37.3 (L) 40.0 - 52.0 %   MCV 85.8 80.0 - 100.0 fL   MCH 29.9 26.0 - 34.0 pg   MCHC 34.8 32.0  - 36.0 g/dL   RDW 15.4 (H) 11.5 - 14.5 %   Platelets 210 150 - 440 K/uL    Comment: Performed at [facility], [address] [place], [place] [address]  ECHOCARDIOGRAM COMPLETE     Status: None   Collection Time: 03/25/17  8:26 AM  Result Value Ref Range   Weight 3,048 oz   Height 68 in   BP 145/58 mmHg  Heparin level (unfractionated)     Status: Abnormal   Collection Time: 03/25/17  8:49 AM  Result Value Ref Range   Heparin Unfractionated 0.29 (L) 0.30 - 0.70 IU/mL    Comment:        IF HEPARIN RESULTS ARE BELOW EXPECTED VALUES, AND PATIENT DOSAGE HAS BEEN CONFIRMED, SUGGEST FOLLOW UP TESTING OF ANTITHROMBIN III LEVELS. Performed at [facility], [address] [place], [place] [address]   NM Myocar Multi W/Spect Tamela Oddi Motion / EF     Status: None   Collection Time: 03/25/17  1:03 PM  Result Value Ref Range   Rest HR 61 bpm   Rest BP 153/89 mmHg   Exercise duration (sec) 5 sec   Percent HR 64 %   Exercise duration (min) 1 min   Estimated workload 1.0 METS   Peak HR 97 bpm   Peak BP 205/101 mmHg   MPHR 151 bpm   SSS 11    SRS 18    SDS 2    TID 0.84    LV sys vol 90 mL   LV dias vol 169 62 - 150 mL  Basic metabolic panel     Status: Abnormal   Collection Time: 03/28/17 11:10 AM  Result Value Ref Range   Sodium 140 135 - 145 mmol/L   Potassium 3.6 3.5 - 5.1 mmol/L   Chloride 111 101 - 111 mmol/L  CO2 22 22 - 32 mmol/L   Glucose, Bld 116 (H) 65 - 99 mg/dL   BUN 24 (H) 6 - 20 mg/dL   Creatinine, Ser 1.13 0.61 - 1.24 mg/dL   Calcium 9.0 8.9 - 10.3 mg/dL   GFR calc non Af Amer >60 >60 mL/min   GFR calc Af Amer >60 >60 mL/min    Comment: (NOTE) The eGFR has been calculated using the CKD EPI equation. This calculation has not been validated in all clinical situations. eGFR's persistently <60 mL/min signify possible Chronic Kidney Disease.    Anion gap 7 5 - 15    Comment: Performed at [facility], [address]  [place], [place] [address]  CBC     Status: Abnormal   Collection Time: 03/28/17 11:10 AM  Result Value Ref Range   WBC 6.7 3.8 - 10.6 K/uL   RBC 4.46 4.40 - 5.90 MIL/uL   Hemoglobin 13.1 13.0 - 18.0 g/dL   HCT 37.9 (L) 40.0 - 52.0 %   MCV 84.9 80.0 - 100.0 fL   MCH 29.3 26.0 - 34.0 pg   MCHC 34.6 32.0 - 36.0 g/dL   RDW 15.0 (H) 11.5 - 14.5 %   Platelets 203 150 - 440 K/uL    Comment: Performed at [facility], [address] [place], [place] [address]  Troponin I     Status: Abnormal   Collection Time: 03/28/17 11:10 AM  Result Value Ref Range   Troponin I 0.24 (HH) <0.03 ng/mL    Comment: CRITICAL RESULT CALLED TO, READ BACK BY AND VERIFIED WITH [facility] NEEDHAM AT 1203 03/28/17 DAS Performed at [facility], [address] [place], [place] [address]   Troponin I     Status: Abnormal   Collection Time: 03/28/17 12:36 PM  Result Value Ref Range   Troponin I 0.26 (HH) <0.03 ng/mL    Comment: CRITICAL VALUE NOTED. VALUE IS CONSISTENT WITH PREVIOUSLY REPORTED/CALLED VALUE SNJ Performed at [facility], [address] [place], [place] [address]    Objective  Body mass index is 30.19 kg/m. Wt Readings from Last 3 Encounters:  03/29/17 192 lb 12 oz (87.4 kg)  03/28/17 190 lb (86.2 kg)  03/24/17 190 lb 8 oz (86.4 kg)   Temp Readings from Last 3 Encounters:  03/29/17 97.7 F (36.5 C) (Oral)  03/28/17 97.9 F (36.6 C) (Oral)  03/25/17 98.3 F (36.8 C) (Oral)   BP Readings from Last 3 Encounters:  03/29/17 (!) 142/84  03/28/17 (!) 175/94  03/25/17 (!) 145/58   Pulse Readings from Last 3 Encounters:  03/29/17 86  03/28/17 65  03/25/17 65   O2 sat room air 96%  Physical Exam  Constitutional: He is oriented to person, place, and time and well-developed, well-nourished, and in no distress.  HENT:  Head: Normocephalic and atraumatic.  Eyes: Conjunctivae are normal. Pupils are equal, round, and reactive to light.  Cardiovascular: Normal rate,  regular rhythm and normal heart sounds.  No murmur heard. Pulmonary/Chest: Effort normal and breath sounds normal. He has no wheezes.  Neurological: He is alert and oriented to person, place, and time. Gait normal.  Skin: Skin is warm and dry.  Psychiatric: Mood, memory, affect and judgment normal.  Nursing note and vitals reviewed.   Assessment   1. Chest pain with evidence of elevated troponin and CAD on CT chest and evidence prior MI on NM stress test  2. Tobacco dependence and paraseptal and Centrilobular emphysema and 4 mm  nodule left lung  3. HTN  4. Thoracic aortic aneurysm 4.4-5.2 cm with aortic ulcer also with irregular plaque/early ulceration anterior and left of inferior mesenteric artery origin. Right common iliac aneurysm   5.HM 6. GERD with small hiatal hernia.  7. H/o BPH 8. Cervicalgia C Tneck 02/28/13 marked facet arthrosis C5/C6 with moderate left neural foraminal stenosis  Plan  1.  appt 03/31/17 with Dr. Clayborn Bigness per pt cards considering cath he does had LAD dz and other atherosclerosis on CT chest  Cont aspirin 81 mg qd  See lipid 03/24/17 on crestor 20 mg qd  2. rec smoking cessation counseled x 3 minutes  was on Wellbutrin SR 150 mg qd will change to XL 300 mg qd  Start symbicort and albuterol prn inhalers  Will need to repeat CT chest in 1 year with lung nodule  3. Increase losartan from 50 to 100 mg qd  Cont BB Cont Norvasc  F/u in 1 month (Had BMET 03/28/17 elevated BUN, had CBC 03/28/17 sl low hematocrit, had lipid, A1C 5.6 03/24/17, TSH nl 03/24/17 Consider UA in future and PSA with DRE for wellness  4. F/u with Dr. Lucky Cowboy 04/2017 will have cards and Dr. Lucky Cowboy weigh in on ulcerations seen on CT chest  5.  See labs above  Had flu and prevnar will need pna 23 11/2017 if not had  Consider Tdap shingrix in future   Need to check UA, PSA with DRE in future, Hep B/C status in future   Unclear if pt had colonoscopy in the past  Need to repeat CT chest 03/2018 6. Out of PPI  refill today rec take qd  7. Cont flomax  8. Reviewed CT neck  Consider referral to ortho spine if continues to bother stable for now   Provider: Dr. Olivia Mackie McLean-Scocuzza-Internal Medicine

## 2017-03-29 NOTE — Patient Instructions (Addendum)
Follow up in 1 month sooner if needed  Follow up with cardiology as scheduled   Chronic Obstructive Pulmonary Disease Chronic obstructive pulmonary disease (COPD) is a long-term (chronic) lung problem. When you have COPD, it is hard for air to get in and out of your lungs. The way your lungs work will never return to normal. Usually the condition gets worse over time. There are things you can do to keep yourself as healthy as possible. Your doctor may treat your condition with:  Medicines.  Quitting smoking, if you smoke.  Rehabilitation. This may involve a team of specialists.  Oxygen.  Exercise and changes to your diet.  Lung surgery.  Comfort measures (palliative care).  Follow these instructions at home: Medicines  Take over-the-counter and prescription medicines only as told by your doctor.  Talk to your doctor before taking any cough or allergy medicines. You may need to avoid medicines that cause your lungs to be dry. Lifestyle  If you smoke, stop. Smoking makes the problem worse. If you need help quitting, ask your doctor.  Avoid being around things that make your breathing worse. This may include smoke, chemicals, and fumes.  Stay active, but remember to also rest.  Learn and use tips on how to relax.  Make sure you get enough sleep. Most adults need at least 7 hours a night.  Eat healthy foods. Eat smaller meals more often. Rest before meals. Controlled breathing  Learn and use tips on how to control your breathing as told by your doctor. Try: ? Breathing in (inhaling) through your nose for 1 second. Then, pucker your lips and breath out (exhale) through your lips for 2 seconds. ? Putting one hand on your belly (abdomen). Breathe in slowly through your nose for 1 second. Your hand on your belly should move out. Pucker your lips and breathe out slowly through your lips. Your hand on your belly should move in as you breathe out. Controlled coughing  Learn and  use controlled coughing to clear mucus from your lungs. The steps are: 1. Lean your head a little forward. 2. Breathe in deeply. 3. Try to hold your breath for 3 seconds. 4. Keep your mouth slightly open while coughing 2 times. 5. Spit any mucus out into a tissue. 6. Rest and do the steps again 1 or 2 times as needed. General instructions  Make sure you get all the shots (vaccines) that your doctor recommends. Ask your doctor about a flu shot and a pneumonia shot.  Use oxygen therapy and therapy to help improve your lungs (pulmonary rehabilitation) if told by your doctor. If you need home oxygen therapy, ask your doctor if you should buy a tool to measure your oxygen level (oximeter).  Make a COPD action plan with your doctor. This helps you know what to do if you feel worse than usual.  Manage any other conditions you have as told by your doctor.  Avoid going outside when it is very hot, cold, or humid.  Avoid people who have a sickness you can catch (contagious).  Keep all follow-up visits as told by your doctor. This is important. Contact a doctor if:  You cough up more mucus than usual.  There is a change in the color or thickness of the mucus.  It is harder to breathe than usual.  Your breathing is faster than usual.  You have trouble sleeping.  You need to use your medicines more often than usual.  You have trouble doing  your normal activities such as getting dressed or walking around the house. Get help right away if:  You have shortness of breath while resting.  You have shortness of breath that stops you from: ? Being able to talk. ? Doing normal activities.  Your chest hurts for longer than 5 minutes.  Your skin color is more blue than usual.  Your pulse oximeter shows that you have low oxygen for longer than 5 minutes.  You have a fever.  You feel too tired to breathe normally. Summary  Chronic obstructive pulmonary disease (COPD) is a long-term lung  problem.  The way your lungs work will never return to normal. Usually the condition gets worse over time. There are things you can do to keep yourself as healthy as possible.  Take over-the-counter and prescription medicines only as told by your doctor.  If you smoke, stop. Smoking makes the problem worse. This information is not intended to replace advice given to you by your health care provider. Make sure you discuss any questions you have with your health care provider. Document Released: 08/25/2007 Document Revised: 08/14/2015 Document Reviewed: 11/02/2012 Elsevier Interactive Patient Education  2017 Elsevier Inc.   Nonspecific Chest Pain Chest pain can be caused by many different conditions. There is a chance that your pain could be related to something serious, such as a heart attack or a blood clot in your lungs. Chest pain can also be caused by conditions that are not life-threatening. If you have chest pain, it is very important to follow up with your doctor. Follow these instructions at home: Medicines  If you were prescribed an antibiotic medicine, take it as told by your doctor. Do not stop taking the antibiotic even if you start to feel better.  Take over-the-counter and prescription medicines only as told by your doctor. Lifestyle  Do not use any products that contain nicotine or tobacco, such as cigarettes and e-cigarettes. If you need help quitting, ask your doctor.  Do not drink alcohol.  Make lifestyle changes as told by your doctor. These may include: ? Getting regular exercise. Ask your doctor for some activities that are safe for you. ? Eating a heart-healthy diet. A diet specialist (dietitian) can help you to learn healthy eating options. ? Staying at a healthy weight. ? Managing diabetes, if needed. ? Lowering your stress, as with deep breathing or spending time in nature. General instructions  Avoid any activities that make you feel chest pain.  If your  chest pain is because of heartburn: ? Raise (elevate) the head of your bed about 6 inches (15 cm). You can do this by putting blocks under the bed legs at the head of the bed. ? Do not sleep with extra pillows under your head. That does not help heartburn.  Keep all follow-up visits as told by your doctor. This is important. This includes any further testing if your chest pain does not go away. Contact a doctor if:  Your chest pain does not go away.  You have a rash with blisters on your chest.  You have a fever.  You have chills. Get help right away if:  Your chest pain is worse.  You have a cough that gets worse, or you cough up blood.  You have very bad (severe) pain in your belly (abdomen).  You are very weak.  You pass out (faint).  You have either of these for no clear reason: ? Sudden chest discomfort. ? Sudden discomfort in your  arms, back, neck, or jaw.  You have shortness of breath at any time.  You suddenly start to sweat, or your skin gets clammy.  You feel sick to your stomach (nauseous).  You throw up (vomit).  You suddenly feel light-headed or dizzy.  Your heart starts to beat fast, or it feels like it is skipping beats. These symptoms may be an emergency. Do not wait to see if the symptoms will go away. Get medical help right away. Call your local emergency services (911 in the [place] Do not drive yourself to the hospital. This information is not intended to replace advice given to you by your health care provider. Make sure you discuss any questions you have with your health care provider. Document Released: 08/25/2007 Document Revised: 12/01/2015 Document Reviewed: 12/01/2015 Elsevier Interactive Patient Education  2017 Elsevier Inc.  Cervical Radiculopathy Cervical radiculopathy means that a nerve in the neck is pinched or bruised. This can cause pain or loss of feeling (numbness) that runs from your neck to your arm and fingers. Follow these  instructions at home: Managing pain  Take over-the-counter and prescription medicines only as told by your doctor.  If directed, put ice on the injured or painful area. ? Put ice in a plastic bag. ? Place a towel between your skin and the bag. ? Leave the ice on for 20 minutes, 2-3 times per day.  If ice does not help, you can try using heat. Take a warm shower or warm bath, or use a heat pack as told by your doctor.  You may try a gentle neck and shoulder massage. Activity  Rest as needed. Follow instructions from your doctor about any activities to avoid.  Do exercises as told by your doctor or physical therapist. General instructions  If you were given a soft collar, wear it as told by your doctor.  Use a flat pillow when you sleep.  Keep all follow-up visits as told by your doctor. This is important. Contact a doctor if:  Your condition does not improve with treatment. Get help right away if:  Your pain gets worse and is not controlled with medicine.  You lose feeling or feel weak in your hand, arm, face, or leg.  You have a fever.  You have a stiff neck.  You cannot control when you poop or pee (have incontinence).  You have trouble with walking, balance, or talking. This information is not intended to replace advice given to you by your health care provider. Make sure you discuss any questions you have with your health care provider. Document Released: 02/25/2011 Document Revised: 08/14/2015 Document Reviewed: 05/02/2014 Elsevier Interactive Patient Education  [employer]2018 Elsevier Inc.

## 2017-03-31 ENCOUNTER — Telehealth: Payer: Self-pay | Admitting: Internal Medicine

## 2017-03-31 NOTE — Telephone Encounter (Signed)
Copied from CRM #[id number]. Topic: General - Other >> Mar 31, 2017 10:33 AM Viviann SpareWhite, Selina wrote: Reason for CRM: Patient called to let Dr. Judie GrieveMcLean-Scocuzza know that he can not afford the 2 inhaler that she send in for him and would like to know if she could send in something cheaper.  albuterol (PROVENTIL HFA;VENTOLIN HFA) 108 (90 Base) MCG/ACT inhaler  budesonide-formoterol (SYMBICORT) 160-4.5 MCG/ACT inhaler

## 2017-03-31 NOTE — Telephone Encounter (Signed)
Please advise pt

## 2017-03-31 NOTE — Telephone Encounter (Signed)
Pt states he can not afford inhalers called in for him. Asking if there is a less expensive alternative.   Dr. Shirlee LatchMcLean- Scocuzza ordered albuterol (PROVENTIL HFA;VENTOLIN HFA) 108 (90 Base) MCG/ACT inhaler  budesonide-formoterol (SYMBICORT) 160-4.5 MCG/ACT inhaler

## 2017-04-03 ENCOUNTER — Encounter: Payer: Self-pay | Admitting: Emergency Medicine

## 2017-04-03 ENCOUNTER — Emergency Department: Payer: Medicare HMO

## 2017-04-03 ENCOUNTER — Inpatient Hospital Stay
Admission: EM | Admit: 2017-04-03 | Discharge: 2017-04-05 | DRG: 247 | Disposition: A | Discharge status: Home / Self Care | Payer: Medicare HMO | Attending: Specialist | Admitting: Specialist

## 2017-04-03 ENCOUNTER — Other Ambulatory Visit: Payer: Self-pay

## 2017-04-03 DIAGNOSIS — I119 Hypertensive heart disease without heart failure: Secondary | ICD-10-CM | POA: Diagnosis present

## 2017-04-03 DIAGNOSIS — K219 Gastro-esophageal reflux disease without esophagitis: Secondary | ICD-10-CM | POA: Diagnosis present

## 2017-04-03 DIAGNOSIS — Z7982 Long term (current) use of aspirin: Secondary | ICD-10-CM

## 2017-04-03 DIAGNOSIS — N400 Enlarged prostate without lower urinary tract symptoms: Secondary | ICD-10-CM | POA: Diagnosis present

## 2017-04-03 DIAGNOSIS — Z7983 Long term (current) use of bisphosphonates: Secondary | ICD-10-CM

## 2017-04-03 DIAGNOSIS — E782 Mixed hyperlipidemia: Secondary | ICD-10-CM | POA: Diagnosis present

## 2017-04-03 DIAGNOSIS — Z7951 Long term (current) use of inhaled steroids: Secondary | ICD-10-CM | POA: Diagnosis not present

## 2017-04-03 DIAGNOSIS — I214 Non-ST elevation (NSTEMI) myocardial infarction: Principal | ICD-10-CM | POA: Diagnosis present

## 2017-04-03 DIAGNOSIS — F419 Anxiety disorder, unspecified: Secondary | ICD-10-CM | POA: Diagnosis present

## 2017-04-03 DIAGNOSIS — Z79899 Other long term (current) drug therapy: Secondary | ICD-10-CM

## 2017-04-03 DIAGNOSIS — R079 Chest pain, unspecified: Secondary | ICD-10-CM

## 2017-04-03 DIAGNOSIS — I714 Abdominal aortic aneurysm, without rupture: Secondary | ICD-10-CM | POA: Diagnosis present

## 2017-04-03 DIAGNOSIS — I739 Peripheral vascular disease, unspecified: Secondary | ICD-10-CM | POA: Diagnosis present

## 2017-04-03 DIAGNOSIS — Z8249 Family history of ischemic heart disease and other diseases of the circulatory system: Secondary | ICD-10-CM

## 2017-04-03 DIAGNOSIS — F329 Major depressive disorder, single episode, unspecified: Secondary | ICD-10-CM | POA: Diagnosis present

## 2017-04-03 DIAGNOSIS — Z87442 Personal history of urinary calculi: Secondary | ICD-10-CM

## 2017-04-03 DIAGNOSIS — I712 Thoracic aortic aneurysm, without rupture: Secondary | ICD-10-CM | POA: Diagnosis present

## 2017-04-03 DIAGNOSIS — I2510 Atherosclerotic heart disease of native coronary artery without angina pectoris: Secondary | ICD-10-CM | POA: Diagnosis present

## 2017-04-03 DIAGNOSIS — R972 Elevated prostate specific antigen [PSA]: Secondary | ICD-10-CM | POA: Diagnosis present

## 2017-04-03 DIAGNOSIS — I723 Aneurysm of iliac artery: Secondary | ICD-10-CM | POA: Diagnosis present

## 2017-04-03 DIAGNOSIS — Z881 Allergy status to other antibiotic agents status: Secondary | ICD-10-CM | POA: Diagnosis not present

## 2017-04-03 DIAGNOSIS — J449 Chronic obstructive pulmonary disease, unspecified: Secondary | ICD-10-CM | POA: Diagnosis present

## 2017-04-03 DIAGNOSIS — F17210 Nicotine dependence, cigarettes, uncomplicated: Secondary | ICD-10-CM | POA: Diagnosis present

## 2017-04-03 LAB — CBC WITH DIFFERENTIAL/PLATELET
BASOS ABS: 0 10*3/uL (ref 0–0.1)
BASOS PCT: 0 %
EOS ABS: 0.2 10*3/uL (ref 0–0.7)
EOS PCT: 3 %
HCT: 37.7 % — ABNORMAL LOW (ref 40.0–52.0)
Hemoglobin: 12.9 g/dL — ABNORMAL LOW (ref 13.0–18.0)
Lymphocytes Relative: 15 %
Lymphs Abs: 1 10*3/uL (ref 1.0–3.6)
MCH: 29.1 pg (ref 26.0–34.0)
MCHC: 34.2 g/dL (ref 32.0–36.0)
MCV: 85.2 fL (ref 80.0–100.0)
Monocytes Absolute: 0.6 10*3/uL (ref 0.2–1.0)
Monocytes Relative: 10 %
Neutro Abs: 4.8 10*3/uL (ref 1.4–6.5)
Neutrophils Relative %: 72 %
PLATELETS: 225 10*3/uL (ref 150–440)
RBC: 4.42 MIL/uL (ref 4.40–5.90)
RDW: 15.6 % — ABNORMAL HIGH (ref 11.5–14.5)
WBC: 6.6 10*3/uL (ref 3.8–10.6)

## 2017-04-03 LAB — COMPREHENSIVE METABOLIC PANEL
ALT: 25 U/L (ref 17–63)
AST: 33 U/L (ref 15–41)
Albumin: 3.9 g/dL (ref 3.5–5.0)
Alkaline Phosphatase: 77 U/L (ref 38–126)
Anion gap: 7 (ref 5–15)
BILIRUBIN TOTAL: 0.8 mg/dL (ref 0.3–1.2)
BUN: 24 mg/dL — AB (ref 6–20)
CO2: 23 mmol/L (ref 22–32)
CREATININE: 1.05 mg/dL (ref 0.61–1.24)
Calcium: 9.1 mg/dL (ref 8.9–10.3)
Chloride: 111 mmol/L (ref 101–111)
Glucose, Bld: 130 mg/dL — ABNORMAL HIGH (ref 65–99)
Potassium: 3.5 mmol/L (ref 3.5–5.1)
Sodium: 141 mmol/L (ref 135–145)
TOTAL PROTEIN: 7 g/dL (ref 6.5–8.1)

## 2017-04-03 LAB — TROPONIN I
Troponin I: 0.74 ng/mL (ref <0.03)
Troponin I: 0.82 ng/mL (ref <0.03)
Troponin I: 3.11 ng/mL (ref <0.03)
Troponin I: 4.58 ng/mL (ref <0.03)

## 2017-04-03 LAB — TSH: TSH: 1.782 u[IU]/mL (ref 0.350–4.500)

## 2017-04-03 LAB — PROTIME-INR
INR: 1
INR: 1.14
PROTHROMBIN TIME: 13.1 s (ref 11.4–15.2)
Prothrombin Time: 14.5 seconds (ref 11.4–15.2)

## 2017-04-03 LAB — HEPARIN LEVEL (UNFRACTIONATED): HEPARIN UNFRACTIONATED: 0.39 [IU]/mL (ref 0.30–0.70)

## 2017-04-03 LAB — APTT: aPTT: 61 seconds — ABNORMAL HIGH (ref 24–36)

## 2017-04-03 LAB — BRAIN NATRIURETIC PEPTIDE: B NATRIURETIC PEPTIDE 5: 334 pg/mL — AB (ref 0.0–100.0)

## 2017-04-03 MED ORDER — METOPROLOL SUCCINATE ER 50 MG PO TB24
50.0000 mg | ORAL_TABLET | Freq: Every day | ORAL | Status: DC
  Administered 2017-04-03 – 2017-04-05 (×3): 50 mg via ORAL
  Filled 2017-04-03 (×3): qty 1

## 2017-04-03 MED ORDER — PANTOPRAZOLE SODIUM 40 MG PO TBEC
40.0000 mg | DELAYED_RELEASE_TABLET | Freq: Every day | ORAL | Status: DC
  Administered 2017-04-03 – 2017-04-05 (×3): 40 mg via ORAL
  Filled 2017-04-03 (×3): qty 1

## 2017-04-03 MED ORDER — ASPIRIN 81 MG PO CHEW
81.0000 mg | CHEWABLE_TABLET | ORAL | Status: AC
  Administered 2017-04-04: 81 mg via ORAL
  Filled 2017-04-03: qty 1

## 2017-04-03 MED ORDER — ACETAMINOPHEN 325 MG PO TABS
650.0000 mg | ORAL_TABLET | Freq: Four times a day (QID) | ORAL | Status: DC | PRN
  Administered 2017-04-03: 650 mg via ORAL
  Filled 2017-04-03: qty 2

## 2017-04-03 MED ORDER — ENOXAPARIN SODIUM 100 MG/ML ~~LOC~~ SOLN
1.0000 mg/kg | Freq: Two times a day (BID) | SUBCUTANEOUS | Status: DC
  Filled 2017-04-03: qty 1

## 2017-04-03 MED ORDER — MOMETASONE FURO-FORMOTEROL FUM 200-5 MCG/ACT IN AERO
2.0000 | INHALATION_SPRAY | Freq: Two times a day (BID) | RESPIRATORY_TRACT | Status: DC
  Administered 2017-04-03 – 2017-04-05 (×5): 2 via RESPIRATORY_TRACT
  Filled 2017-04-03: qty 8.8

## 2017-04-03 MED ORDER — ALPRAZOLAM 0.25 MG PO TABS
0.2500 mg | ORAL_TABLET | Freq: Three times a day (TID) | ORAL | Status: DC | PRN
  Administered 2017-04-03 – 2017-04-05 (×4): 0.25 mg via ORAL
  Filled 2017-04-03 (×5): qty 1

## 2017-04-03 MED ORDER — SODIUM CHLORIDE 0.9% FLUSH
3.0000 mL | Freq: Two times a day (BID) | INTRAVENOUS | Status: DC
  Administered 2017-04-04 – 2017-04-05 (×2): 3 mL via INTRAVENOUS

## 2017-04-03 MED ORDER — AMLODIPINE BESYLATE 5 MG PO TABS
5.0000 mg | ORAL_TABLET | Freq: Every day | ORAL | Status: DC
  Administered 2017-04-03 – 2017-04-05 (×3): 5 mg via ORAL
  Filled 2017-04-03 (×3): qty 1

## 2017-04-03 MED ORDER — ONDANSETRON HCL 4 MG PO TABS: 4.0000 mg | ORAL_TABLET | Freq: Four times a day (QID) | ORAL | Status: DC | PRN

## 2017-04-03 MED ORDER — NITROGLYCERIN 2 % TD OINT
0.5000 [in_us] | TOPICAL_OINTMENT | TRANSDERMAL | Status: AC
  Administered 2017-04-03: 0.5 [in_us] via TOPICAL
  Filled 2017-04-03: qty 1

## 2017-04-03 MED ORDER — BUPROPION HCL ER (XL) 150 MG PO TB24
300.0000 mg | ORAL_TABLET | Freq: Every day | ORAL | Status: DC
  Administered 2017-04-03 – 2017-04-05 (×3): 300 mg via ORAL
  Filled 2017-04-03 (×3): qty 2

## 2017-04-03 MED ORDER — ALBUTEROL SULFATE (2.5 MG/3ML) 0.083% IN NEBU: 2.5000 mg | INHALATION_SOLUTION | RESPIRATORY_TRACT | Status: DC | PRN

## 2017-04-03 MED ORDER — ACETAMINOPHEN 650 MG RE SUPP: 650.0000 mg | Freq: Four times a day (QID) | RECTAL | Status: DC | PRN

## 2017-04-03 MED ORDER — SODIUM CHLORIDE 0.9 % WEIGHT BASED INFUSION: 1.0000 mL/kg/h | INTRAVENOUS | Status: DC

## 2017-04-03 MED ORDER — CLOPIDOGREL BISULFATE 75 MG PO TABS
75.0000 mg | ORAL_TABLET | Freq: Every day | ORAL | Status: DC
  Administered 2017-04-03 – 2017-04-05 (×3): 75 mg via ORAL
  Filled 2017-04-03 (×3): qty 1

## 2017-04-03 MED ORDER — SODIUM CHLORIDE 0.9 % IV SOLN
INTRAVENOUS | Status: DC
  Administered 2017-04-03 – 2017-04-05 (×5): via INTRAVENOUS

## 2017-04-03 MED ORDER — MORPHINE SULFATE (PF) 2 MG/ML IV SOLN
2.0000 mg | INTRAVENOUS | Status: DC | PRN
  Administered 2017-04-03 – 2017-04-04 (×4): 2 mg via INTRAVENOUS
  Filled 2017-04-03 (×4): qty 1

## 2017-04-03 MED ORDER — ENOXAPARIN SODIUM 100 MG/ML ~~LOC~~ SOLN
1.0000 mg/kg | SUBCUTANEOUS | Status: AC
  Administered 2017-04-03: 85 mg via SUBCUTANEOUS
  Filled 2017-04-03: qty 1

## 2017-04-03 MED ORDER — ASPIRIN EC 81 MG PO TBEC
81.0000 mg | DELAYED_RELEASE_TABLET | Freq: Every day | ORAL | Status: DC
  Administered 2017-04-03 – 2017-04-05 (×3): 81 mg via ORAL
  Filled 2017-04-03 (×3): qty 1

## 2017-04-03 MED ORDER — NITROGLYCERIN 2 % TD OINT
1.0000 [in_us] | TOPICAL_OINTMENT | Freq: Four times a day (QID) | TRANSDERMAL | Status: DC
  Administered 2017-04-03 – 2017-04-05 (×5): 1 [in_us] via TOPICAL
  Filled 2017-04-03 (×7): qty 1

## 2017-04-03 MED ORDER — DOCUSATE SODIUM 100 MG PO CAPS
100.0000 mg | ORAL_CAPSULE | Freq: Two times a day (BID) | ORAL | Status: DC
  Administered 2017-04-03 – 2017-04-05 (×5): 100 mg via ORAL
  Filled 2017-04-03 (×5): qty 1

## 2017-04-03 MED ORDER — ONDANSETRON HCL 4 MG/2ML IJ SOLN: 4.0000 mg | Freq: Four times a day (QID) | INTRAMUSCULAR | Status: DC | PRN

## 2017-04-03 MED ORDER — ROSUVASTATIN CALCIUM 10 MG PO TABS
20.0000 mg | ORAL_TABLET | Freq: Every day | ORAL | Status: DC
  Administered 2017-04-03 – 2017-04-05 (×3): 20 mg via ORAL
  Filled 2017-04-03 (×3): qty 2

## 2017-04-03 MED ORDER — TAMSULOSIN HCL 0.4 MG PO CAPS
0.4000 mg | ORAL_CAPSULE | Freq: Every day | ORAL | Status: DC
  Administered 2017-04-03 – 2017-04-05 (×3): 0.4 mg via ORAL
  Filled 2017-04-03 (×3): qty 1

## 2017-04-03 MED ORDER — SODIUM CHLORIDE 0.9 % IV SOLN: 250.0000 mL | INTRAVENOUS | Status: DC | PRN

## 2017-04-03 MED ORDER — HEPARIN (PORCINE) IN NACL 100-0.45 UNIT/ML-% IJ SOLN
1150.0000 [IU]/h | INTRAMUSCULAR | Status: DC
  Administered 2017-04-03: 1000 [IU]/h via INTRAVENOUS
  Administered 2017-04-04: 1150 [IU]/h via INTRAVENOUS
  Filled 2017-04-03 (×2): qty 250

## 2017-04-03 MED ORDER — LOSARTAN POTASSIUM 50 MG PO TABS
100.0000 mg | ORAL_TABLET | Freq: Every day | ORAL | Status: DC
  Administered 2017-04-03 – 2017-04-05 (×3): 100 mg via ORAL
  Filled 2017-04-03 (×3): qty 2

## 2017-04-03 MED ORDER — SODIUM CHLORIDE 0.9 % WEIGHT BASED INFUSION: 3.0000 mL/kg/h | INTRAVENOUS | Status: AC

## 2017-04-03 MED ORDER — SODIUM CHLORIDE 0.9% FLUSH
3.0000 mL | INTRAVENOUS | Status: DC | PRN
Start: 2017-04-03 — End: 2017-04-04

## 2017-04-03 MED ORDER — NITROGLYCERIN 0.4 MG SL SUBL
0.4000 mg | SUBLINGUAL_TABLET | SUBLINGUAL | Status: DC | PRN
Start: 2017-04-03 — End: 2017-04-05
  Administered 2017-04-03 – 2017-04-04 (×3): 0.4 mg via SUBLINGUAL
  Filled 2017-04-03 (×2): qty 1

## 2017-04-03 NOTE — Progress Notes (Addendum)
ANTICOAGULATION CONSULT NOTE   Pharmacy Consult for heparin Indication: chest pain/ACS  Allergies  Allergen Reactions  . Sulfa Antibiotics     swelling    Patient Measurements: Height: 5\' 7"  (170.2 cm) Weight: 190 lb (86.2 kg) IBW/kg (Calculated) : 66.1 Heparin Dosing Weight: 83.7 kg  Vital Signs: Temp: 97.9 F (36.6 C) (01/13 1722) Temp Source: Oral (01/13 1722) BP: 161/85 (01/13 1722) Pulse Rate: 44 (01/13 1722)  Labs: Recent Labs    04/03/17 0334 04/03/17 0531 04/03/17 1255 04/03/17 1503  HGB 12.9*  --   --   --   HCT 37.7*  --   --   --   PLT 225  --   --   --   APTT  --   --   --  61*  LABPROT 13.1  --   --  14.5  INR 1.00  --   --  1.14  CREATININE 1.05  --   --   --   TROPONINI 0.74* 0.82* 3.11* 4.58*    Estimated Creatinine Clearance: 69.6 mL/min (by C-G formula based on SCr of 1.05 mg/dL).   Medications:  Patient was not on any anticoagulants at home.   Assessment: 72 yo male with known CAD found to have an NSTEMI and elevated troponin. Pharmacy has been consulted to dose and monitor heparin drip.   Goal of Therapy:  Heparin level 0.3-0.7 units/ml Monitor platelets by anticoagulation protocol: Yes   Plan:  Patient was on therapeutic enoxaparin 1mg /kg Q12H  that was last given 1/13 at 0600. Therefore did not order bolus.  Start heparin infusion at 1000 units/hr Check anti-Xa level in 6 hours and daily while on heparin Continue to monitor H&H and platelets  Yolanda BonineHannah Lifsey, PharmD Pharmacy Resident 04/03/2017,5:49 PM    01/13 2130 heparin level 0.39. Continue current regimen. Recheck heparin level and CBC with tomorrow AM labs.  Fulton ReekMatt Katherina Wimer, PharmD, BCPS  04/03/17 10:29 PM

## 2017-04-03 NOTE — H&P (Signed)
Glenn Hill is an 72 y.o. male.   Chief Complaint: Chest pain HPI: The patient with past medical history of hypertension, COPD and stable AAA presents to the emergency department complaining of chest pain.  The patient states it began at rest while he was playing a computer game in bed.  He moved around to try to find a more comfortable position.  Then he walked a bit which actually made his chest feel better.  He even ate some and returned to bed but the pain returned.  It was left-sided pressure that radiated down his left arm.  The patient remembers being diaphoretic but denies nausea or shortness of breath.  Laboratory evaluation revealed elevated troponin in the emergency department staff called the hospitalist service for admission.  Past Medical History:  Diagnosis Date  . AAA (abdominal aortic aneurysm) ([place])   . BPH (benign prostatic hyperplasia)   . COPD (chronic obstructive pulmonary disease) ([place])   . Elevated PSA   . HLD (hyperlipidemia)   . HTN (hypertension)   . Kidney stones     Past Surgical History:  Procedure Laterality Date  . Leg circulation Surgery Left     Family History  Problem Relation Age of Onset  . Dementia Father   . Heart attack Father   . Alzheimer's disease Mother   . CAD Brother    Social History:  reports that he has been smoking cigarettes.  He has a 27.50 pack-year smoking history. he has never used smokeless tobacco. He reports that he does not drink alcohol or use drugs.  Allergies:  Allergies  Allergen Reactions  . Sulfa Antibiotics     swelling     (Not in a hospital admission)  Results for orders placed or performed during the hospital encounter of 04/03/17 (from the past 48 hour(s))  Troponin I     Status: Abnormal   Collection Time: 04/03/17  3:34 AM  Result Value Ref Range   Troponin I 0.74 (HH) <0.03 ng/mL    Comment: CRITICAL RESULT CALLED TO, READ BACK BY AND VERIFIED WITH NOEL WEBSTER AT 9518 04/03/17.PMH Performed  at [facility], [address] [place], [place] [address]   Comprehensive metabolic panel     Status: Abnormal   Collection Time: 04/03/17  3:34 AM  Result Value Ref Range   Sodium 141 135 - 145 mmol/L   Potassium 3.5 3.5 - 5.1 mmol/L   Chloride 111 101 - 111 mmol/L   CO2 23 22 - 32 mmol/L   Glucose, Bld 130 (H) 65 - 99 mg/dL   BUN 24 (H) 6 - 20 mg/dL   Creatinine, Ser 1.05 0.61 - 1.24 mg/dL   Calcium 9.1 8.9 - 10.3 mg/dL   Total Protein 7.0 6.5 - 8.1 g/dL   Albumin 3.9 3.5 - 5.0 g/dL   AST 33 15 - 41 U/L   ALT 25 17 - 63 U/L   Alkaline Phosphatase 77 38 - 126 U/L   Total Bilirubin 0.8 0.3 - 1.2 mg/dL   GFR calc non Af Amer >60 >60 mL/min   GFR calc Af Amer >60 >60 mL/min    Comment: (NOTE) The eGFR has been calculated using the CKD EPI equation. This calculation has not been validated in all clinical situations. eGFR's persistently <60 mL/min signify possible Chronic Kidney Disease.    Anion gap 7 5 - 15    Comment: Performed at [facility], [address] [place], [place] [address]  CBC with Differential  Status: Abnormal   Collection Time: 04/03/17  3:34 AM  Result Value Ref Range   WBC 6.6 3.8 - 10.6 K/uL   RBC 4.42 4.40 - 5.90 MIL/uL   Hemoglobin 12.9 (L) 13.0 - 18.0 g/dL   HCT 37.7 (L) 40.0 - 52.0 %   MCV 85.2 80.0 - 100.0 fL   MCH 29.1 26.0 - 34.0 pg   MCHC 34.2 32.0 - 36.0 g/dL   RDW 15.6 (H) 11.5 - 14.5 %   Platelets 225 150 - 440 K/uL   Neutrophils Relative % 72 %   Neutro Abs 4.8 1.4 - 6.5 K/uL   Lymphocytes Relative 15 %   Lymphs Abs 1.0 1.0 - 3.6 K/uL   Monocytes Relative 10 %   Monocytes Absolute 0.6 0.2 - 1.0 K/uL   Eosinophils Relative 3 %   Eosinophils Absolute 0.2 0 - 0.7 K/uL   Basophils Relative 0 %   Basophils Absolute 0.0 0 - 0.1 K/uL    Comment: Performed at [facility], [address] [place], [place] [address]  Protime-INR     Status: None   Collection Time: 04/03/17  3:34 AM  Result Value Ref Range    Prothrombin Time 13.1 11.4 - 15.2 seconds   INR 1.00     Comment: Performed at [facility], [address], [place], [place] [address]  Brain natriuretic peptide     Status: Abnormal   Collection Time: 04/03/17  3:34 AM  Result Value Ref Range   B Natriuretic Peptide 334.0 (H) 0.0 - 100.0 pg/mL    Comment: Performed at [facility], [address] [place],  [address]   Dg Chest 2 View  Result Date: 04/03/2017 CLINICAL DATA:  Recurrent chest pain radiating to LEFT arm. Shortness of breath. History of COPD, hypertension. EXAM: CHEST  2 VIEW COMPARISON:  Chest radiograph March 28, 2017 and CT chest March 24, 2017 FINDINGS: Cardiac silhouette is mildly enlarged and unchanged. Mediastinal silhouette is not suspicious. Known aneurysm better characterized on recent CT. Mild chronic interstitial changes and increased lung volumes without pleural effusion. Faint posterior costophrenic angle airspace opacity. No pneumothorax. Soft tissue planes and included osseous structures are unchanged. IMPRESSION: Posterior confluent fibrosis versus pneumonia in a background of COPD. Mild cardiomegaly. Electronically Signed   By: Elon Alas M.D.   On: 04/03/2017 04:45    Review of Systems  Constitutional: Positive for diaphoresis. Negative for chills and fever.  HENT: Negative for sore throat and tinnitus.   Eyes: Negative for blurred vision and redness.  Respiratory: Negative for cough and shortness of breath.   Cardiovascular: Positive for chest pain. Negative for palpitations, orthopnea and PND.  Gastrointestinal: Negative for abdominal pain, diarrhea, nausea and vomiting.  Genitourinary: Negative for dysuria, frequency and urgency.  Musculoskeletal: Negative for joint pain and myalgias.  Skin: Negative for rash.       No lesions  Neurological: Negative for speech change, focal weakness and weakness.  Endo/Heme/Allergies: Does not bruise/bleed easily.       No  temperature intolerance  Psychiatric/Behavioral: Negative for depression and suicidal ideas.    Blood pressure (!) 168/104, pulse 65, temperature 97.8 F (36.6 C), temperature source Oral, resp. rate (!) 21, height 5' 7"  (1.702 m), weight 86.2 kg (190 lb), SpO2 97 %. Physical Exam  Nursing note and vitals reviewed. Constitutional: He is oriented to person, place, and time. He appears well-developed and well-nourished. No distress.  HENT:  Head: Normocephalic and atraumatic.  Mouth/Throat: Oropharynx  is clear and moist.  Eyes: Conjunctivae and EOM are normal. Pupils are equal, round, and reactive to light. No scleral icterus.  Neck: Normal range of motion. Neck supple. No JVD present. No tracheal deviation present. No thyromegaly present.  Cardiovascular: Normal rate, regular rhythm and normal heart sounds.  Respiratory: Effort normal and breath sounds normal. No respiratory distress.  GI: Soft. Bowel sounds are normal. He exhibits no distension. There is no tenderness.  Genitourinary:  Genitourinary Comments: Deferred  Musculoskeletal: Normal range of motion. He exhibits no edema.  Lymphadenopathy:    He has no cervical adenopathy.  Neurological: He is alert and oriented to person, place, and time. No cranial nerve deficit.  Skin: Skin is warm and dry. No rash noted. No erythema.  Psychiatric: He has a normal mood and affect. His behavior is normal. Judgment and thought content normal.     Assessment/Plan This is a 72 year old male admitted for NSTEMI. 1. NSTEMI: Troponin elevated; EKG shows normal sinus rhythm with trigeminy.  Continue to monitor telemetry.  Therapeutic Lovenox.  Patient is n.p.o. in anticipation for heart catheterization tomorrow.  Consult cardiology. 2.  Hypertension: Uncontrolled; apply Nitropaste to the patient's chest.  Continue amlodipine, losartan and metoprolol. 3.  Hyperlipidemia: Continue statin therapy 4.  COPD: Stable; continue inhaled corticosteroid.   Albuterol as needed. 5.  BPH: Continue tamsulosin 6.  Depression: Continue Wellbutrin 7.  DVT prophylaxis: As above 8.  GI prophylaxis: PPI per home regimen The patient is a full code.  Time spent on admission orders and patient care approximately 45 minutes  Harrie Foreman, MD 04/03/2017, 4:51 AM

## 2017-04-03 NOTE — Progress Notes (Signed)
[facility] - [place] at [facility]   PATIENT NAME: Glenn Hill    MR#:  [id number]  DATE OF BIRTH:  03-31-1945  SUBJECTIVE:   Patient here due to chest pain with elevated troponin. Patient was here about 10 days ago and cardiology recommended cardiac catheterization but the patient refused. Patient now is agreeable to it and therefore will undergo cardiac catheterization tomorrow. Currently chest pain-free and hemodynamically stable.  REVIEW OF SYSTEMS:    Review of Systems  Constitutional: Negative for chills and fever.  HENT: Negative for congestion and tinnitus.   Eyes: Negative for blurred vision and double vision.  Respiratory: Negative for cough, shortness of breath and wheezing.   Cardiovascular: Negative for chest pain, orthopnea and PND.  Gastrointestinal: Negative for abdominal pain, diarrhea, nausea and vomiting.  Genitourinary: Negative for dysuria and hematuria.  Neurological: Negative for dizziness, sensory change and focal weakness.  All other systems reviewed and are negative.   Nutrition: Heart Healthy/Carb control Tolerating Diet: Yes Tolerating PT: Await Eval.   DRUG ALLERGIES:   Allergies  Allergen Reactions  . Sulfa Antibiotics     swelling    VITALS:  Blood pressure (!) 175/84, pulse 71, temperature 97.8 F (36.6 C), temperature source Oral, resp. rate (!) 21, height 5\' 7"  (1.702 m), weight 86.2 kg (190 lb), SpO2 98 %.  PHYSICAL EXAMINATION:   Physical Exam  GENERAL:  72 y.o.-year-old patient lying in bed in no acute distress.  EYES: Pupils equal, round, reactive to light and accommodation. No scleral icterus. Extraocular muscles intact.  HEENT: Head atraumatic, normocephalic. Oropharynx and nasopharynx clear.  NECK:  Supple, no jugular venous distention. No thyroid enlargement, no tenderness.  LUNGS: Normal breath sounds bilaterally, no wheezing, rales, rhonchi. No use of accessory muscles of respiration.  CARDIOVASCULAR:  S1, S2 normal. II/VI SEM at LSB, No rubs, or gallops.  ABDOMEN: Soft, nontender, nondistended. Bowel sounds present. No organomegaly or mass.  EXTREMITIES: No cyanosis, clubbing or edema b/l.    NEUROLOGIC: Cranial nerves II through XII are intact. No focal Motor or sensory deficits b/l.   PSYCHIATRIC: The patient is alert and oriented x 3.  SKIN: No obvious rash, lesion, or ulcer.    LABORATORY PANEL:   CBC Recent Labs  Lab 04/03/17 0334  WBC 6.6  HGB 12.9*  HCT 37.7*  PLT 225   ------------------------------------------------------------------------------------------------------------------  Chemistries  Recent Labs  Lab 04/03/17 0334  NA 141  K 3.5  CL 111  CO2 23  GLUCOSE 130*  BUN 24*  CREATININE 1.05  CALCIUM 9.1  AST 33  ALT 25  ALKPHOS 77  BILITOT 0.8   ------------------------------------------------------------------------------------------------------------------  Cardiac Enzymes Recent Labs  Lab 04/03/17 0531  TROPONINI 0.82*   ------------------------------------------------------------------------------------------------------------------  RADIOLOGY:  Dg Chest 2 View  Result Date: 04/03/2017 CLINICAL DATA:  Recurrent chest pain radiating to LEFT arm. Shortness of breath. History of COPD, hypertension. EXAM: CHEST  2 VIEW COMPARISON:  Chest radiograph March 28, 2017 and CT chest March 24, 2017 FINDINGS: Cardiac silhouette is mildly enlarged and unchanged. Mediastinal silhouette is not suspicious. Known aneurysm better characterized on recent CT. Mild chronic interstitial changes and increased lung volumes without pleural effusion. Faint posterior costophrenic angle airspace opacity. No pneumothorax. Soft tissue planes and included osseous structures are unchanged. IMPRESSION: Posterior confluent fibrosis versus pneumonia in a background of COPD. Mild cardiomegaly. Electronically Signed   By: Awilda Metro M.D.   On: 04/03/2017 04:45      ASSESSMENT AND PLAN:  72 year old male with past medical history of abdominal aortic aneurysm, hypertension, hyperlipidemia, COPD, BPH, history of nephrolithiasis who presents to the hospital due to chest pains and noted to have an elevated troponin.  1. Chest pain with elevated troponin-patient was here about 10 days ago with similar complaints underwent a nuclear medicine stress test which was abnormal and he recommended cardiac catheterization but the patient refused. -Patient is now readmitted with similar complaints and has elevated troponins consistent with a non-ST elevation MI. Seen by cardiology and plan for cardiac catheterization tomorrow as patient is agreeable to it. -Continue aspirin, Plavix, metoprolol, losartan, Crestor, Lovenox.  2. Essential hypertension-continue Toprol, losartan, Norvasc.  3. Hyperlipidemia-continue Crestor.  4. BPH-continue Flomax.  5. COPD-no acute exacerbation. Continue albuterol nebulizers, Dulera.   All the records are reviewed and case discussed with Care Management/Social Worker. Management plans discussed with the patient, family and they are in agreement.  CODE STATUS: Full code  DVT Prophylaxis: Lovenox  TOTAL TIME TAKING CARE OF THIS PATIENT: 30 minutes.   POSSIBLE D/C IN 1-2 DAYS, DEPENDING ON CLINICAL CONDITION.   [place] SirenSAINANI,Jamesia Linnen J M.D on 04/03/2017 at 1:50 PM  Between 7am to 6pm - Pager - [phone]  After 6pm go to [email or web address] - Social research officer, [email or web address] [email or web address]  [employer] [place] [facility]  Office  [phone]  CC: Primary care physician; McLean-Scocuzza, Pasty Spillersracy N, MD

## 2017-04-03 NOTE — Progress Notes (Addendum)
CRITICAL VALUE ALERT  Critical Value: Troponin 3.11  Date & Time Notied:  04/03/17.1414  Provider Notified: DR. Gwen PoundsKowalski  Orders Received/Actions taken: Heparin gtt, Pharmacy notified. Will continue to monitor.

## 2017-04-03 NOTE — ED Provider Notes (Signed)
[facility] Emergency Department Provider Note   ____________________________________________   First MD Initiated Contact with Patient 04/03/17 [phone]     (approximate)  I have reviewed the triage vital signs and the nursing notes.   HISTORY  Chief Complaint Chest Pain    HPI Glenn Hill is a 72 y.o. male who comes into the hospital today with some chest pain.  The patient states that he was on his computer and then he tried to go to sleep.  He states that he developed some pain in the middle of his chest.  His wife made him something to eat and he states that the pain seemed to get worse.  It was about a 6 out of 10 in intensity at that time.  He tried to go back to bed and then the pain started going down his arm which is different than how it had been so he decided to come in.  He reports that the pain started around 2 AM.  The patient was given some aspirin and 1 nitroglycerin spray by EMS.  He felt clammy but denies any distinct shortness of breath.  The patient denies any nausea or vomiting but did feel little dizzy.  His pain is currently a 3 out of 10 in intensity.  The patient came in for evaluation.     Past Medical History:  Diagnosis Date  . AAA (abdominal aortic aneurysm) (HCC)   . BPH (benign prostatic hyperplasia)   . COPD (chronic obstructive pulmonary disease) (HCC)   . Elevated PSA   . HLD (hyperlipidemia)   . HTN (hypertension)   . Kidney stones     Patient Active Problem List   Diagnosis Date Noted  . NSTEMI (non-ST elevated myocardial infarction) (HCC) 04/03/2017  . CAD (coronary artery disease) 03/29/2017  . GERD (gastroesophageal reflux disease) 03/29/2017  . Cervical spinal stenosis 03/29/2017  . Hypertensive urgency 03/24/2017  . BPH (benign prostatic hyperplasia) 08/26/2016  . Tobacco abuse 08/26/2016  . Essential hypertension, benign 04/06/2016  . Hyperlipidemia 04/06/2016  . Thoracic aortic aneurysm (HCC)  04/06/2016  . 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 Leg circulation Surgery Left     Prior to Admission medications   Medication Sig Start Date End Date Taking? Authorizing Provider  albuterol (PROVENTIL HFA;VENTOLIN HFA) 108 (90 Base) MCG/ACT inhaler Inhale 1-2 puffs into the lungs every 6 (six) hours as needed for wheezing or shortness of breath (cough). 03/29/17  Yes McLean-Scocuzza, Pasty Spillersracy N, MD  amLODipine (NORVASC) 5 MG tablet Take 1 tablet (5 mg total) by mouth daily. 09/28/16  Yes Everlene Otherook, Jayce G, DO  aspirin EC 81 MG tablet Take 81 mg by mouth daily.   Yes [provider]  budesonide-formoterol (SYMBICORT) 160-4.5 MCG/ACT inhaler Inhale 2 puffs into the lungs 2 (two) times daily. Rinse mouth 03/29/17  Yes McLean-Scocuzza, Pasty Spillersracy N, MD  buPROPion (WELLBUTRIN XL) 300 MG 24 hr tablet Take 1 tablet (300 mg total) by mouth daily. In am 03/29/17  Yes McLean-Scocuzza, Pasty Spillersracy N, MD  losartan (COZAAR) 100 MG tablet Take 1 tablet (100 mg total) by mouth daily. 03/29/17  Yes McLean-Scocuzza, Pasty Spillersracy N, MD  metoprolol succinate (TOPROL-XL) 50 MG 24 hr tablet Take 1 tablet (50 mg total) by mouth daily. Take with or immediately following a meal. 12/17/16  Yes Glori LuisSonnenberg, Eric G, MD  omeprazole (PRILOSEC) 20 MG capsule Take 1 capsule (20 mg total) by mouth daily.  In am 30 minutss before breakfast 03/29/17  Yes McLean-Scocuzza, Pasty Spillers, MD  rosuvastatin (CRESTOR) 20 MG tablet Take 1 tablet (20 mg total) by mouth daily. 11/26/16  Yes Cook, Jayce G, DO  tamsulosin (FLOMAX) 0.4 MG CAPS capsule Take 0.4 mg by mouth daily.   Yes [provider]    Allergies Sulfa antibiotics  Family History  Problem Relation Age of Onset  . Dementia Father   . Heart attack Father   . Alzheimer's disease Mother   . CAD Brother     Social History Social History   Tobacco Use  . Smoking status: Current Every Day Smoker    Packs/day: 0.50    Years:  55.00    Pack years: 27.50    Types: Cigarettes  . Smokeless tobacco: Never Used  Substance Use Topics  . Alcohol use: No    Alcohol/week: 0.0 oz  . Drug use: No    Review of Systems  Constitutional: Sweatiness Eyes: No visual changes. ENT: No sore throat. Cardiovascular:  chest pain. Respiratory: Denies shortness of breath. Gastrointestinal: no abdominal pain.  No nausea, no vomiting.  No diarrhea.  No constipation. Genitourinary: Negative for dysuria. Musculoskeletal: Negative for back pain. Skin: Negative for rash. Neurological: Dizziness   ____________________________________________   PHYSICAL EXAM:  VITAL SIGNS: ED Triage Vitals  Enc Vitals Group     BP 04/03/17 0335 (!) 168/104     Pulse Rate 04/03/17 0335 65     Resp 04/03/17 0335 (!) 21     Temp 04/03/17 0335 97.8 F (36.6 C)     Temp Source 04/03/17 0335 Oral     SpO2 04/03/17 0335 97 %     Weight 04/03/17 0335 190 lb (86.2 kg)     Height 04/03/17 0335 5\' 7"  (1.702 m)     Head Circumference --      Peak Flow --      Pain Score 04/03/17 0333 2     Pain Loc --      Pain Edu? --      Excl. in GC? --     Constitutional: Alert and oriented. Well appearing and in moderate distress. Eyes: Conjunctivae are normal. PERRL. EOMI. Head: Atraumatic. Nose: No congestion/rhinnorhea. Mouth/Throat: Mucous membranes are moist.  Oropharynx non-erythematous. Cardiovascular: Normal rate, regular rhythm. Grossly normal heart sounds.  Good peripheral circulation. Respiratory: Normal respiratory effort.  No retractions.  Coarse expiratory breath sounds. Gastrointestinal: Soft and nontender. No distention.  Musculoskeletal: No lower extremity tenderness nor edema.   Neurologic:  Normal speech and language.  Skin:  Skin is warm, dry and intact.  Psychiatric: Mood and affect are normal.   ____________________________________________   LABS (all labs ordered are listed, but only abnormal results are displayed)  Labs  Reviewed  TROPONIN I - Abnormal; Notable for the following components:      Result Value   Troponin I 0.74 (*)    All other components within normal limits  COMPREHENSIVE METABOLIC PANEL - Abnormal; Notable for the following components:   Glucose, Bld 130 (*)    BUN 24 (*)    All other components within normal limits  CBC WITH DIFFERENTIAL/PLATELET - Abnormal; Notable for the following components:   Hemoglobin 12.9 (*)    HCT 37.7 (*)    RDW 15.6 (*)    All other components within normal limits  BRAIN NATRIURETIC PEPTIDE - Abnormal; Notable for the following components:   B Natriuretic Peptide 334.0 (*)    All other components  within normal limits  PROTIME-INR   ____________________________________________  EKG  ED ECG REPORT I, Rebecka Apley, the attending physician, personally viewed and interpreted this ECG.   Date: 04/03/2017  EKG Time: 326  Rate: 83  Rhythm: sinus tachycardia  Axis: none  Intervals:none  ST&T Change: none  ____________________________________________  RADIOLOGY  Dg Chest 2 View  Result Date: 04/03/2017 CLINICAL DATA:  Recurrent chest pain radiating to LEFT arm. Shortness of breath. History of COPD, hypertension. EXAM: CHEST  2 VIEW COMPARISON:  Chest radiograph March 28, 2017 and CT chest March 24, 2017 FINDINGS: Cardiac silhouette is mildly enlarged and unchanged. Mediastinal silhouette is not suspicious. Known aneurysm better characterized on recent CT. Mild chronic interstitial changes and increased lung volumes without pleural effusion. Faint posterior costophrenic angle airspace opacity. No pneumothorax. Soft tissue planes and included osseous structures are unchanged. IMPRESSION: Posterior confluent fibrosis versus pneumonia in a background of COPD. Mild cardiomegaly. Electronically Signed   By: Awilda Metro M.D.   On: 04/03/2017 04:45    ____________________________________________   PROCEDURES  Procedure(s) performed:  None  Procedures  Critical Care performed: No  ____________________________________________   INITIAL IMPRESSION / ASSESSMENT AND PLAN / ED COURSE  As part of my medical decision making, I reviewed the following data within the electronic MEDICAL RECORD NUMBER Notes from prior ED visits and [place] Controlled Substance Database   This is a 71 year old male who comes into the hospital today with chest pain.  My differential diagnosis includes acute coronary syndrome, nonspecific chest pain, pneumonia  We did check some blood work to include a CBC CMP and a troponin.  The patient also received a BNP and a PT/INR.  The patient's troponin returned elevated at 0.74 patient also had a chest x-ray which showed some posterior confluent fibrosis versus pneumonia on a background of COPD with some cardiomegaly.  Given the elevated troponin I am concerned for acute coronary syndrome.  I did order a dose of nitroglycerin for the patient.  I did contact the hospitalist and they will start the patient on Lovenox.  The patient will be admitted to the hospitalist service.      ____________________________________________   FINAL CLINICAL IMPRESSION(S) / ED DIAGNOSES  Final diagnoses:  Chest pain, unspecified type     ED Discharge Orders    None       Note:  This document was prepared using Dragon voice recognition software and may include unintentional dictation errors.    Rebecka Apley, MD 04/03/17 [phone]

## 2017-04-03 NOTE — ED Triage Notes (Signed)
Patient presents to Emergency Department via EMS with complaints of chest pain.    Center chest with left arm radiation, SOB and sweaty 7/10 "pushing" pain.  Pt here on 3rd and 7th for similar s/sx.    EMS gave 324 ASA and 1 x SL nitro, scheduled cardiologist follow up with [place] yet to happen.

## 2017-04-03 NOTE — Progress Notes (Signed)
This RN was notified by [place] [employer] that patient's wife was found deceased in her apartment. Pt here for NSTEMI, troponins are trending up, and patient is scheduled for cardiac cath, due to his condition Dr. Cherlynn KaiserSainani was made aware of situation. Orders for Xanax PRN received. Later on pt found out by sister in law that wife passed away. Chaplain at bedside. Will continue to monitor.

## 2017-04-03 NOTE — Consult Note (Signed)
[facility] Cardiology Consultation Note  Patient ID: NORMON PETTIJOHN, MRN: [id number], DOB/AGE: [birth year] 72 y.o. Admit date: 04/03/2017   Date of Consult: 04/03/2017 Primary Physician: McLean-Scocuzza, Pasty Spillers, MD Primary Cardiologist: Call would  Chief Complaint:  Chief Complaint  Patient presents with  . Chest Pain   Reason for Consult: Chest pain  HPI: 72 y.o. male with known coronary artery disease essential hypertension mixed hyperlipidemia abdominal aortic aneurysm COPD from smoking who has been on appropriate medication management for risk reduction but has continued to smoke.  He has had some waxing and waning episodes of chest discomfort radiating into both arms causing shortness of breath over the last several weeks although significant resting issues yesterday.  He went back to sleep and ended up waking up in the middle the night having severe chest discomfort and concerns.  He has had relief of this chest discomfort with heparin nitroglycerin and oxygen but does have an EKG showing normal sinus rhythm left ventricular hypertrophy with preventricular contractions.  Troponin is 0.82 consistent with non-ST elevation myocardial infarction.  Patient understands current risks and further concerns  Past Medical History:  Diagnosis Date  . AAA (abdominal aortic aneurysm) (HCC)   . BPH (benign prostatic hyperplasia)   . COPD (chronic obstructive pulmonary disease) (HCC)   . Elevated PSA   . HLD (hyperlipidemia)   . HTN (hypertension)   . Kidney stones       Surgical History:  Past Surgical History:  Procedure Laterality Date  . Leg circulation Surgery Left      Home Meds: Prior to Admission medications   Medication Sig Start Date End Date Taking? Authorizing Provider  albuterol (PROVENTIL HFA;VENTOLIN HFA) 108 (90 Base) MCG/ACT inhaler Inhale 1-2 puffs into the lungs every 6 (six) hours as needed for wheezing or shortness of breath (cough). 03/29/17  Yes McLean-Scocuzza,  Pasty Spillers, MD  amLODipine (NORVASC) 5 MG tablet Take 1 tablet (5 mg total) by mouth daily. 09/28/16  Yes Everlene Other G, DO  aspirin EC 81 MG tablet Take 81 mg by mouth daily.   Yes [provider]  budesonide-formoterol (SYMBICORT) 160-4.5 MCG/ACT inhaler Inhale 2 puffs into the lungs 2 (two) times daily. Rinse mouth 03/29/17  Yes McLean-Scocuzza, Pasty Spillers, MD  buPROPion (WELLBUTRIN XL) 300 MG 24 hr tablet Take 1 tablet (300 mg total) by mouth daily. In am 03/29/17  Yes McLean-Scocuzza, Pasty Spillers, MD  losartan (COZAAR) 100 MG tablet Take 1 tablet (100 mg total) by mouth daily. 03/29/17  Yes McLean-Scocuzza, Pasty Spillers, MD  metoprolol succinate (TOPROL-XL) 50 MG 24 hr tablet Take 1 tablet (50 mg total) by mouth daily. Take with or immediately following a meal. 12/17/16  Yes Glori Luis, MD  omeprazole (PRILOSEC) 20 MG capsule Take 1 capsule (20 mg total) by mouth daily. In am 30 minutss before breakfast 03/29/17  Yes McLean-Scocuzza, Pasty Spillers, MD  rosuvastatin (CRESTOR) 20 MG tablet Take 1 tablet (20 mg total) by mouth daily. 11/26/16  Yes Cook, Jayce G, DO  tamsulosin (FLOMAX) 0.4 MG CAPS capsule Take 0.4 mg by mouth daily.   Yes [provider]    Inpatient Medications:  . amLODipine  5 mg Oral Daily  . aspirin EC  81 mg Oral Daily  . buPROPion  300 mg Oral Daily  . clopidogrel  75 mg Oral Daily  . docusate sodium  100 mg Oral BID  . enoxaparin (LOVENOX) injection  1 mg/kg Subcutaneous Q12H  . losartan  100  mg Oral Daily  . metoprolol succinate  50 mg Oral Daily  . mometasone-formoterol  2 puff Inhalation BID  . nitroGLYCERIN  1 inch Topical Q6H  . pantoprazole  40 mg Oral Daily  . rosuvastatin  20 mg Oral Daily  . tamsulosin  0.4 mg Oral Daily   . sodium chloride 125 mL/hr at 04/03/17 0540    Allergies:  Allergies  Allergen Reactions  . Sulfa Antibiotics     swelling    Social History   Socioeconomic History  . Marital status: Married    Spouse name: Not on file  .  Number of children: Not on file  . Years of education: Not on file  . Highest education level: Not on file  Social Needs  . Financial resource strain: Not on file  . Food insecurity - worry: Not on file  . Food insecurity - inability: Not on file  . Transportation needs - medical: Not on file  . Transportation needs - non-medical: Not on file  Occupational History  . Not on file  Tobacco Use  . Smoking status: Current Every Day Smoker    Packs/day: 0.50    Years: 55.00    Pack years: 27.50    Types: Cigarettes  . Smokeless tobacco: Never Used  Substance and Sexual Activity  . Alcohol use: No    Alcohol/week: 0.0 oz  . Drug use: No  . Sexual activity: No  Other Topics Concern  . Not on file  Social History Narrative   Independent at baseline. Works in [employer]     Family History  Problem Relation Age of Onset  . Dementia Father   . Heart attack Father   . Alzheimer's disease Mother   . CAD Brother      Review of Systems Positive for chest pain Negative for: General:  chills, fever, night sweats or weight changes.  Cardiovascular: PND orthopnea syncope dizziness  Dermatological skin lesions rashes Respiratory: Cough congestion Urologic: Frequent urination urination at night and hematuria Abdominal: negative for nausea, vomiting, diarrhea, bright red blood per rectum, melena, or hematemesis Neurologic: negative for visual changes, and/or hearing changes  All other systems reviewed and are otherwise negative except as noted above.  Labs: Recent Labs    04/03/17 0334 04/03/17 0531  TROPONINI 0.74* 0.82*   Lab Results  Component Value Date   WBC 6.6 04/03/2017   HGB 12.9 (L) 04/03/2017   HCT 37.7 (L) 04/03/2017   MCV 85.2 04/03/2017   PLT 225 04/03/2017    Recent Labs  Lab 04/03/17 0334  NA 141  K 3.5  CL 111  CO2 23  BUN 24*  CREATININE 1.05  CALCIUM 9.1  PROT 7.0  BILITOT 0.8  ALKPHOS 77  ALT 25  AST 33  GLUCOSE 130*   Lab Results   Component Value Date   CHOL 162 03/24/2017   HDL 33 (L) 03/24/2017   LDLCALC 71 03/24/2017   TRIG 289 (H) 03/24/2017   No results found for: DDIMER  Radiology/Studies:  Dg Chest 2 View  Result Date: 04/03/2017 CLINICAL DATA:  Recurrent chest pain radiating to LEFT arm. Shortness of breath. History of COPD, hypertension. EXAM: CHEST  2 VIEW COMPARISON:  Chest radiograph March 28, 2017 and CT chest March 24, 2017 FINDINGS: Cardiac silhouette is mildly enlarged and unchanged. Mediastinal silhouette is not suspicious. Known aneurysm better characterized on recent CT. Mild chronic interstitial changes and increased lung volumes without pleural effusion. Faint posterior costophrenic angle airspace  opacity. No pneumothorax. Soft tissue planes and included osseous structures are unchanged. IMPRESSION: Posterior confluent fibrosis versus pneumonia in a background of COPD. Mild cardiomegaly. Electronically Signed   By: Awilda Metro M.D.   On: 04/03/2017 04:45   Nm Myocar Multi W/spect W/wall Motion / Ef  Result Date: 03/25/2017  Blood pressure demonstrated a normal response to exercise.  There was no ST segment deviation noted during stress.  Defect 1: There is a medium defect of mild severity present in the mid inferior, apical inferior and apex location.  Findings consistent with prior myocardial infarction.  This is a low risk study.  The left ventricular ejection fraction is moderately decreased (30-44%).    Dg Chest Port 1 View  Result Date: 03/28/2017 CLINICAL DATA:  Left arm pain extending up to left shoulder, across back to right side of neck since about 9 AM today HTN, smoker- .5 pack/day EXAM: PORTABLE CHEST 1 VIEW COMPARISON:  10/06/2016 FINDINGS: Normal mediastinum and cardiac silhouette. Normal pulmonary vasculature. No evidence of effusion, infiltrate, or pneumothorax. No acute bony abnormality. IMPRESSION: No acute cardiopulmonary process. Electronically Signed   By: Genevive Bi M.D.   On: 03/28/2017 12:02   Ct Angio Chest Aorta W And/or Wo Contrast  Result Date: 03/24/2017 CLINICAL DATA:  72 year old male with a history of chest pain and hypertension. History of abdominal aortic aneurysm EXAM: CT ANGIOGRAPHY CHEST, ABDOMEN AND PELVIS TECHNIQUE: Multidetector CT imaging through the chest, abdomen and pelvis was performed using the standard protocol during bolus administration of intravenous contrast. Multiplanar reconstructed images and MIPs were obtained and reviewed to evaluate the vascular anatomy. CONTRAST:  ISOVUE-370 IOPAMIDOL (ISOVUE-370) INJECTION 76% COMPARISON:  08/02/2012, 09/12/2006, 06/06/2006 FINDINGS: CTA CHEST FINDINGS Cardiovascular: Heart: Cardiac diameter borderline enlarged. No pericardial fluid/ thickening. Coronary calcifications of the left anterior descending coronary artery. Minimal calcifications of the aortic valve. Aorta: Diameter of ascending thoracic aorta measures 4.4 cm. No dissection flap. Mild atherosclerotic changes of the aortic arch. Branch vessels are patent with 3 vessel aortic arch. Greatest diameter of the descending thoracic aorta above the hiatus measures 4.3 cm at site of aortic ulcer, projecting toward the anterior right. The ulcer measures 1.4 cm at the base, with an estimated depth of 1.1 cm. Overall, there has not been significant change from the comparison CT of 08/02/2012. No associated inflammatory changes within the fat. No periaortic fluid. 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 No inflammatory changes at this site. No periaortic fluid. The diameter of the aorta at the aortic hiatus appears relatively unchanged to the CT 08/02/2012, however, no parasagittal images are available for comparison on the prior study. Pulmonary arteries: No central, lobar, segmental, or proximal subsegmental filling defects.  Mediastinum/Nodes: No mediastinal adenopathy. Small lymph nodes are present. Unremarkable thoracic inlet. Unremarkable appearance of the thoracic esophagus. Lungs/Pleura: Paraseptal and centrilobular emphysema. No confluent airspace disease. No pleural effusion. No pneumothorax. No endotracheal or endobronchial debris. No bronchial wall thickening. 4 mm nodule at the left lung apex adjacent to the fissure. No pneumothorax. Musculoskeletal: No acute displaced fracture. Degenerative changes of the thoracic spine. Review of the MIP images confirms the above findings. CTA ABDOMEN AND PELVIS FINDINGS VASCULAR Aorta: Below the aortic hiatus, were the diameter measures greatest, the diameter of the aorta is relatively unchanged from the comparison CT. No dissection flap. No periaortic fluid. Similar appearance of irregular plaque/ early ulceration projecting anterior and  to the left at the level of the inferior mesenteric artery origin. Appearance is unchanged from the comparison. Diameter at the celiac artery measures 3.4 cm. Diameter at the superior mesenteric artery measures 3.0 cm. Diameter at the renal artery origins measures 2.4 cm. No inflammatory changes or periaortic fluid. Diameter of the distal aorta at the bifurcation measures 2.3 cm. Celiac: Minimal atherosclerotic changes at the origin of the celiac artery. SMA: Minimal atherosclerotic changes at the origin of the SMA. Renals: The bilateral main renal arteries are patent with no significant atherosclerotic changes. Small accessory left renal artery just anterior to the main left renal artery, as well as an additional accessory renal artery cephalad to the main renal artery. IMA: Inferior mesenteric arteries patent. 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 No dissection. Hypogastric artery originates from the aneurysm and remains patent. External iliac artery patent with  no significant stenosis or occlusion. Proximal SFA and profunda femoris are patent. Left lower extremity: No dissection or aneurysm of the left common iliac artery. There appears to be stenosis of the hypogastric artery origin secondary to calcified and soft plaque. Mild atherosclerotic changes of the left external iliac artery. Common femoral artery, proximal SFA and profunda femoris patent. Veins: Unremarkable appearance of the venous system. Review of the MIP images confirms the above findings. NON-VASCULAR Hepatobiliary: Hypodense lesion within the right liver, with only a single early arterial phase, in the region of the previously identified hemangioma. Lesion appears smaller on today's study measuring only approximately 2.5 cm. Unremarkable gallbladder. Pancreas: Unremarkable pancreas Spleen: Unremarkable spleen Adrenals/Urinary Tract: Unremarkable adrenal glands. Right: Nonobstructive stone at the superior collecting system of the right kidney measures 19 mm. No hydronephrosis. No perinephric stranding. Unremarkable course of the right ureter. Low-density lesion on the lateral cortex on image 110 compatible with a simple cyst. Small lesions at the posterior cortex are too small to characterize. Low-density lesion at the inferior cortex on image 123, compatible with simple cyst. Left: Nonobstructive stone at the anterior collecting system of the left kidney Unremarkable course of the left ureter. No perinephric stranding. No left-sided hydronephrosis. Low-density cyst at the superior cortex on image 92, simple cyst. Additional smaller low-density lesions on the anterior and lateral cortex are too small to characterize. Unremarkable appearance of the urinary bladder . Stomach/Bowel: Small hiatal hernia. Unremarkable stomach. Unremarkable small bowel with no abnormal distention. No focal wall thickening. No transition point or inflammatory changes. Normal appendix. Mild stool burden. No abnormal distention. No  inflammatory changes. Mild colonic diverticular change. Lymphatic: No abdominal lymphadenopathy.  No inflammatory changes. Reproductive: Transverse diameter of the prostate measures 5.6 cm Other: Bilateral fat containing inguinal hernia. Fat containing umbilical hernia. Musculoskeletal: No acute fracture. Degenerative changes of the lumbar spine. Mild degenerative changes of the hips. IMPRESSION: No acute vascular abnormality. 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 Diameter of the aorta at the SMA origin measures 3.0 cm. Irregular ulcerated plaque of the mid descending thoracic aorta, unchanged from the comparison CT. Diameter of the aorta at this level measures approximately 4.2 cm, with depth of the ulcer approximately 1.1 cm. No associated inflammatory changes. Additional irregular/ulcerated plaque of the infrarenal abdominal aorta at the level of the IMA. Aortic Atherosclerosis (ICD10-I70.0). Paraseptal and centrilobular emphysema.  Emphysema (ICD10-J43.9). Small nodule  at the left lung apex. Given the apparent risk factors, noncontrast chest CT can be considered in 12 months. This recommendation follows the consensus statement: Guidelines for Management of Incidental Pulmonary Nodules Detected on CT Images: From the Fleischner Society 2017; Radiology 2017; 284:228-243. Bilateral nonobstructing nephrolithiasis. Bilateral benign cysts of the kidneys. There are multiple bilateral kidney lesions which are too small to characterize. Small hiatal hernia. Re- demonstration of right liver lobe lesion, which is been previously characterized as hemangioma. Diverticular disease without evidence of acute diverticulitis. Single vessel coronary artery disease. Electronically Signed   By: Gilmer Mor D.O.   On: 03/24/2017 12:26   Ct Angio Abd/pel W  And/or Wo Contrast  Result Date: 03/24/2017 CLINICAL DATA:  72 year old male with a history of chest pain and hypertension. History of abdominal aortic aneurysm EXAM: CT ANGIOGRAPHY CHEST, ABDOMEN AND PELVIS TECHNIQUE: Multidetector CT imaging through the chest, abdomen and pelvis was performed using the standard protocol during bolus administration of intravenous contrast. Multiplanar reconstructed images and MIPs were obtained and reviewed to evaluate the vascular anatomy. CONTRAST:  ISOVUE-370 IOPAMIDOL (ISOVUE-370) INJECTION 76% COMPARISON:  08/02/2012, 09/12/2006, 06/06/2006 FINDINGS: CTA CHEST FINDINGS Cardiovascular: Heart: Cardiac diameter borderline enlarged. No pericardial fluid/ thickening. Coronary calcifications of the left anterior descending coronary artery. Minimal calcifications of the aortic valve. Aorta: Diameter of ascending thoracic aorta measures 4.4 cm. No dissection flap. Mild atherosclerotic changes of the aortic arch. Branch vessels are patent with 3 vessel aortic arch. Greatest diameter of the descending thoracic aorta above the hiatus measures 4.3 cm at site of aortic ulcer, projecting toward the anterior right. The ulcer measures 1.4 cm at the base, with an estimated depth of 1.1 cm. Overall, there has not been significant change from the comparison CT of 08/02/2012. No associated inflammatory changes within the fat. No periaortic fluid. 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 No inflammatory changes at this site. No periaortic fluid. The diameter of the aorta at the aortic hiatus appears relatively unchanged to the CT 08/02/2012, however, no parasagittal images are available for comparison on the prior study. Pulmonary arteries: No central, lobar, segmental, or proximal subsegmental filling defects. Mediastinum/Nodes: No mediastinal adenopathy. Small lymph nodes are present.  Unremarkable thoracic inlet. Unremarkable appearance of the thoracic esophagus. Lungs/Pleura: Paraseptal and centrilobular emphysema. No confluent airspace disease. No pleural effusion. No pneumothorax. No endotracheal or endobronchial debris. No bronchial wall thickening. 4 mm nodule at the left lung apex adjacent to the fissure. No pneumothorax. Musculoskeletal: No acute displaced fracture. Degenerative changes of the thoracic spine. Review of the MIP images confirms the above findings. CTA ABDOMEN AND PELVIS FINDINGS VASCULAR Aorta: Below the aortic hiatus, were the diameter measures greatest, the diameter of the aorta is relatively unchanged from the comparison CT. No dissection flap. No periaortic fluid. Similar appearance of irregular plaque/ early ulceration projecting anterior and to the left at the level of the inferior mesenteric artery origin. Appearance is unchanged from the comparison. Diameter at the celiac artery measures 3.4 cm. Diameter at the superior mesenteric artery measures 3.0 cm. Diameter at the renal artery origins measures 2.4 cm. No inflammatory changes or periaortic fluid. Diameter of the distal aorta at the bifurcation measures 2.3 cm. Celiac: Minimal atherosclerotic changes at the origin of the celiac artery. SMA: Minimal atherosclerotic changes at the origin of the SMA. Renals: The bilateral main renal arteries are patent with no significant atherosclerotic changes. Small  accessory left renal artery just anterior to the main left renal artery, as well as an additional accessory renal artery cephalad to the main renal artery. IMA: Inferior mesenteric arteries patent. 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 No dissection. Hypogastric artery originates from the aneurysm and remains patent. External iliac artery patent with no significant stenosis or occlusion. Proximal SFA and profunda femoris are  patent. Left lower extremity: No dissection or aneurysm of the left common iliac artery. There appears to be stenosis of the hypogastric artery origin secondary to calcified and soft plaque. Mild atherosclerotic changes of the left external iliac artery. Common femoral artery, proximal SFA and profunda femoris patent. Veins: Unremarkable appearance of the venous system. Review of the MIP images confirms the above findings. NON-VASCULAR Hepatobiliary: Hypodense lesion within the right liver, with only a single early arterial phase, in the region of the previously identified hemangioma. Lesion appears smaller on today's study measuring only approximately 2.5 cm. Unremarkable gallbladder. Pancreas: Unremarkable pancreas Spleen: Unremarkable spleen Adrenals/Urinary Tract: Unremarkable adrenal glands. Right: Nonobstructive stone at the superior collecting system of the right kidney measures 19 mm. No hydronephrosis. No perinephric stranding. Unremarkable course of the right ureter. Low-density lesion on the lateral cortex on image 110 compatible with a simple cyst. Small lesions at the posterior cortex are too small to characterize. Low-density lesion at the inferior cortex on image 123, compatible with simple cyst. Left: Nonobstructive stone at the anterior collecting system of the left kidney Unremarkable course of the left ureter. No perinephric stranding. No left-sided hydronephrosis. Low-density cyst at the superior cortex on image 92, simple cyst. Additional smaller low-density lesions on the anterior and lateral cortex are too small to characterize. Unremarkable appearance of the urinary bladder . Stomach/Bowel: Small hiatal hernia. Unremarkable stomach. Unremarkable small bowel with no abnormal distention. No focal wall thickening. No transition point or inflammatory changes. Normal appendix. Mild stool burden. No abnormal distention. No inflammatory changes. Mild colonic diverticular change. Lymphatic: No  abdominal lymphadenopathy.  No inflammatory changes. Reproductive: Transverse diameter of the prostate measures 5.6 cm Other: Bilateral fat containing inguinal hernia. Fat containing umbilical hernia. Musculoskeletal: No acute fracture. Degenerative changes of the lumbar spine. Mild degenerative changes of the hips. IMPRESSION: No acute vascular abnormality. 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 Diameter of the aorta at the SMA origin measures 3.0 cm. Irregular ulcerated plaque of the mid descending thoracic aorta, unchanged from the comparison CT. Diameter of the aorta at this level measures approximately 4.2 cm, with depth of the ulcer approximately 1.1 cm. No associated inflammatory changes. Additional irregular/ulcerated plaque of the infrarenal abdominal aorta at the level of the IMA. Aortic Atherosclerosis (ICD10-I70.0). Paraseptal and centrilobular emphysema.  Emphysema (ICD10-J43.9). Small nodule at the left lung apex. Given the apparent risk factors, noncontrast chest CT can be considered in 12 months. This recommendation follows the consensus statement: Guidelines for Management of Incidental Pulmonary Nodules Detected on CT Images: From the Fleischner Society 2017; Radiology 2017; 284:228-243. Bilateral nonobstructing nephrolithiasis. Bilateral benign cysts of the kidneys. There are multiple bilateral kidney lesions which are too small to characterize. Small hiatal hernia. Re- demonstration of right liver lobe lesion, which is been previously characterized as hemangioma. Diverticular disease without evidence of acute diverticulitis. Single vessel coronary artery disease. Electronically Signed  By: Gilmer MorJaime  Wagner D.O.   On: 03/24/2017 12:26    EKG: Normal sinus rhythm with preventricular contractions and left ventricular  hypertrophy  Weights: Filed Weights   04/03/17 0335  Weight: 86.2 kg (190 lb)     Physical Exam: Blood pressure (!) 175/84, pulse 71, temperature 97.8 F (36.6 C), temperature source Oral, resp. rate (!) 21, height 5\' 7"  (1.702 m), weight 86.2 kg (190 lb), SpO2 98 %. Body mass index is 29.76 kg/m. General: Well developed, well nourished, in no acute distress. Head eyes ears nose throat: Normocephalic, atraumatic, sclera non-icteric, no xanthomas, nares are without discharge. No apparent thyromegaly and/or mass  Lungs: Normal respiratory effort.  no wheezes, no rales, no rhonchi.  Heart: RRR with normal S1 S2. no murmur gallop, no rub, PMI is normal size and placement, carotid upstroke normal without bruit, jugular venous pressure is normal Abdomen: Soft, non-tender, non-distended with normoactive bowel sounds. No hepatomegaly. No rebound/guarding. No obvious abdominal masses. Abdominal aorta is normal size without bruit Extremities: No edema. no cyanosis, no clubbing, no ulcers  Peripheral : 2+ bilateral upper extremity pulses, 2+ bilateral femoral pulses, 2+ bilateral dorsal pedal pulse Neuro: Alert and oriented. No facial asymmetry. No focal deficit. Moves all extremities spontaneously. Musculoskeletal: Normal muscle tone without kyphosis Psych:  Responds to questions appropriately with a normal affect.    Assessment: 72 year old male with essential hypertension mixed hyperlipidemia coronary artery disease peripheral vascular disease on appropriate medications with a non-ST elevation myocardial infarction  Plan: 1.  Continue heparin aspirin and Plavix for acute myocardial infarction 2.  Nitrates for chest discomfort 3.  High intensity cholesterol therapy 4.  Patient's to discontinue smoking as discussed 5.  Echocardiogram for LV systolic dysfunction valvular heart disease contributing to above 6.  Hypertension control with calcium channel blocker and beta-blocker 7.  Proceed to  cardiac catheterization to assess coronary anatomy and further treatment thereof is necessary.  Patient understands the risk and benefits of cardiac catheterization.  This includes a possibility of death stroke heart attack infection bleeding or blood clot.  He is at low risk for conscious sedation  Signed, Lamar BlinksBruce J Alquan Morrish M.D. [facility] HospitalFACC [facility] 04/03/2017, 9:19 AM

## 2017-04-03 NOTE — ED Notes (Signed)
CRITICAL LAB: TROPONIN is 0.74, [facility], Dr. Zenda AlpersWebster notified, orders received

## 2017-04-03 NOTE — Progress Notes (Signed)
ANTICOAGULATION CONSULT NOTE - Initial Consult  Pharmacy Consult for Lovenox dosing Indication: chest pain/ACS  Allergies  Allergen Reactions  . Sulfa Antibiotics     swelling    Patient Measurements: Height: 5\' 7"  (170.2 cm) Weight: 190 lb (86.2 kg) IBW/kg (Calculated) : 66.1 Enoxaparin Dosing Weight: 86 kg  Vital Signs: Temp: 97.8 F (36.6 C) (01/13 0335) Temp Source: Oral (01/13 0335) BP: 170/109 (01/13 0459) Pulse Rate: 73 (01/13 0459)  Labs: Recent Labs    04/03/17 0334  HGB 12.9*  HCT 37.7*  PLT 225  LABPROT 13.1  INR 1.00  CREATININE 1.05  TROPONINI 0.74*    Estimated Creatinine Clearance: 69.6 mL/min (by C-G formula based on SCr of 1.05 mg/dL).   Medical History: Past Medical History:  Diagnosis Date  . AAA (abdominal aortic aneurysm) (HCC)   . BPH (benign prostatic hyperplasia)   . COPD (chronic obstructive pulmonary disease) (HCC)   . Elevated PSA   . HLD (hyperlipidemia)   . HTN (hypertension)   . Kidney stones     Medications:  No anticoaguoation in PTA meds  Assessment: Trop= 0.74  Goal of Therapy:  Monitor platelets by anticoagulation protocol: Yes   Plan:  Lovenox 1 mg/kg q 12 hours ordered. F/u labs per protocol.  Glenn Hill 04/03/2017,5:31 AM

## 2017-04-04 ENCOUNTER — Encounter
Admission: EM | Disposition: A | Discharge status: Home / Self Care | Payer: Self-pay | Source: Home / Self Care | Attending: Specialist

## 2017-04-04 HISTORY — PX: LEFT HEART CATH AND CORONARY ANGIOGRAPHY: CATH118249

## 2017-04-04 HISTORY — PX: CORONARY STENT INTERVENTION: CATH118234

## 2017-04-04 LAB — CBC
HCT: 38.2 % — ABNORMAL LOW (ref 40.0–52.0)
Hemoglobin: 13.3 g/dL (ref 13.0–18.0)
MCH: 29.9 pg (ref 26.0–34.0)
MCHC: 34.8 g/dL (ref 32.0–36.0)
MCV: 85.9 fL (ref 80.0–100.0)
PLATELETS: 229 10*3/uL (ref 150–440)
RBC: 4.44 MIL/uL (ref 4.40–5.90)
RDW: 15.8 % — ABNORMAL HIGH (ref 11.5–14.5)
WBC: 10.5 10*3/uL (ref 3.8–10.6)

## 2017-04-04 LAB — HEPARIN LEVEL (UNFRACTIONATED): HEPARIN UNFRACTIONATED: 0.27 [IU]/mL — AB (ref 0.30–0.70)

## 2017-04-04 SURGERY — LEFT HEART CATH AND CORONARY ANGIOGRAPHY
Anesthesia: Moderate Sedation

## 2017-04-04 MED ORDER — ONDANSETRON HCL 4 MG/2ML IJ SOLN: 4.0000 mg | Freq: Four times a day (QID) | INTRAMUSCULAR | Status: DC | PRN

## 2017-04-04 MED ORDER — FENTANYL CITRATE (PF) 100 MCG/2ML IJ SOLN
INTRAMUSCULAR | Status: DC | PRN
  Administered 2017-04-04: 25 ug via INTRAVENOUS

## 2017-04-04 MED ORDER — MIDAZOLAM HCL 2 MG/2ML IJ SOLN
INTRAMUSCULAR | Status: DC | PRN
  Administered 2017-04-04: 1 mg via INTRAVENOUS

## 2017-04-04 MED ORDER — LABETALOL HCL 5 MG/ML IV SOLN: 10.0000 mg | INTRAVENOUS | Status: AC | PRN

## 2017-04-04 MED ORDER — SODIUM CHLORIDE 0.9 % IV SOLN
0.2500 mg/kg/h | INTRAVENOUS | Status: AC
  Filled 2017-04-04: qty 250

## 2017-04-04 MED ORDER — TICAGRELOR 90 MG PO TABS
ORAL_TABLET | ORAL | Status: DC | PRN
  Administered 2017-04-04: 180 mg via ORAL

## 2017-04-04 MED ORDER — SODIUM CHLORIDE 0.9 % IV SOLN
INTRAVENOUS | Status: AC | PRN
  Administered 2017-04-04: 1.75 mg/kg/h via INTRAVENOUS

## 2017-04-04 MED ORDER — TICAGRELOR 90 MG PO TABS
ORAL_TABLET | ORAL | Status: AC
  Filled 2017-04-04: qty 2

## 2017-04-04 MED ORDER — ASPIRIN 81 MG PO CHEW
CHEWABLE_TABLET | ORAL | Status: AC
  Filled 2017-04-04: qty 3

## 2017-04-04 MED ORDER — BIVALIRUDIN TRIFLUOROACETATE 250 MG IV SOLR
INTRAVENOUS | Status: AC
  Filled 2017-04-04: qty 250

## 2017-04-04 MED ORDER — TICAGRELOR 90 MG PO TABS: 90.0000 mg | ORAL_TABLET | Freq: Two times a day (BID) | ORAL | Status: DC

## 2017-04-04 MED ORDER — HYDRALAZINE HCL 20 MG/ML IJ SOLN: 5.0000 mg | INTRAMUSCULAR | Status: AC | PRN

## 2017-04-04 MED ORDER — MIDAZOLAM HCL 2 MG/2ML IJ SOLN
INTRAMUSCULAR | Status: AC
  Filled 2017-04-04: qty 2

## 2017-04-04 MED ORDER — BIVALIRUDIN BOLUS VIA INFUSION - CUPID
INTRAVENOUS | Status: DC | PRN
  Administered 2017-04-04: 64.65 mg via INTRAVENOUS

## 2017-04-04 MED ORDER — ASPIRIN 81 MG PO CHEW
81.0000 mg | CHEWABLE_TABLET | Freq: Every day | ORAL | Status: DC
  Administered 2017-04-05: 81 mg via ORAL
  Filled 2017-04-04: qty 1

## 2017-04-04 MED ORDER — HEPARIN (PORCINE) IN NACL 2-0.9 UNIT/ML-% IJ SOLN
INTRAMUSCULAR | Status: AC
  Filled 2017-04-04: qty 500

## 2017-04-04 MED ORDER — ASPIRIN 81 MG PO CHEW
CHEWABLE_TABLET | ORAL | Status: DC | PRN
  Administered 2017-04-04: 243 mg via ORAL

## 2017-04-04 MED ORDER — IOPAMIDOL (ISOVUE-300) INJECTION 61%
INTRAVENOUS | Status: DC | PRN
  Administered 2017-04-04: 110 mL via INTRA_ARTERIAL

## 2017-04-04 MED ORDER — IOPAMIDOL (ISOVUE-300) INJECTION 61%
INTRAVENOUS | Status: DC | PRN
  Administered 2017-04-04: 170 mL via INTRA_ARTERIAL

## 2017-04-04 MED ORDER — FENTANYL CITRATE (PF) 100 MCG/2ML IJ SOLN
INTRAMUSCULAR | Status: AC
  Filled 2017-04-04: qty 2

## 2017-04-04 MED ORDER — HEPARIN BOLUS VIA INFUSION
1200.0000 [IU] | Freq: Once | INTRAVENOUS | Status: AC
  Administered 2017-04-04: 1200 [IU] via INTRAVENOUS
  Filled 2017-04-04: qty 1200

## 2017-04-04 MED ORDER — NITROGLYCERIN 5 MG/ML IV SOLN
INTRAVENOUS | Status: AC
  Filled 2017-04-04: qty 10

## 2017-04-04 MED ORDER — TICAGRELOR 90 MG PO TABS
90.0000 mg | ORAL_TABLET | Freq: Two times a day (BID) | ORAL | Status: DC
  Administered 2017-04-05 (×2): 90 mg via ORAL
  Filled 2017-04-04 (×2): qty 1

## 2017-04-04 MED ORDER — ONDANSETRON HCL 4 MG/2ML IJ SOLN
INTRAMUSCULAR | Status: DC | PRN
  Administered 2017-04-04: 4 mg via INTRAVENOUS

## 2017-04-04 MED ORDER — ONDANSETRON HCL 4 MG/2ML IJ SOLN
INTRAMUSCULAR | Status: AC
  Filled 2017-04-04: qty 2

## 2017-04-04 MED ORDER — ACETAMINOPHEN 325 MG PO TABS: 650.0000 mg | ORAL_TABLET | ORAL | Status: DC | PRN

## 2017-04-04 SURGICAL SUPPLY — 18 items
BALLN TREK RX 2.5X15 (BALLOONS) ×3
BALLOON TREK RX 2.5X15 (BALLOONS) IMPLANT
CATH 5FR JR4 DIAGNOSTIC (CATHETERS) ×2 IMPLANT
CATH INFINITI 5FR ANG PIGTAIL (CATHETERS) ×2 IMPLANT
CATH INFINITI 5FR JL4 (CATHETERS) ×2 IMPLANT
CATH INFINITI 5FR JL5 (CATHETERS) ×2 IMPLANT
CATH VISTA GUIDE 6FR XB3.5 (CATHETERS) ×2 IMPLANT
DEVICE CLOSURE MYNXGRIP 6/7F (Vascular Products) ×2 IMPLANT
DEVICE INFLAT 30 PLUS (MISCELLANEOUS) ×2 IMPLANT
KIT MANI 3VAL PERCEP (MISCELLANEOUS) ×3 IMPLANT
NDL PERC 18GX7CM (NEEDLE) IMPLANT
NEEDLE PERC 18GX7CM (NEEDLE) ×3 IMPLANT
PACK CARDIAC CATH (CUSTOM PROCEDURE TRAY) ×3 IMPLANT
SHEATH AVANTI 6FR X 11CM (SHEATH) ×2 IMPLANT
SHEATH PINNACLE 5F 10CM (SHEATH) ×2 IMPLANT
STENT SIERRA 3.50 X 23 MM (Permanent Stent) ×2 IMPLANT
WIRE EMERALD 3MM-J .035X150CM (WIRE) ×2 IMPLANT
WIRE G HI TQ BMW 190 (WIRE) ×2 IMPLANT

## 2017-04-04 NOTE — Progress Notes (Signed)
Report received from Vicki,RN. Nurse stated patient will not be up until around 7:30pm.

## 2017-04-04 NOTE — Progress Notes (Signed)
[facility] Cardiology [facility] Encounter Note  Patient: Glenn Hill / Admit Date: 04/03/2017 / Date of Encounter: 04/04/2017, 5:24 PM   Subjective: Patient with no further chest discomfort.  Peak troponin 4.5 consistent with non-ST elevation myocardial infarction.  EKG unchanged Cardiac cath showing normal LV systolic function with ejection fraction of 50% Occluded small right coronary artery in the mid section with good collaterals from left anterior descending artery and septal arteries Significant mid LAD stenosis of 80% Review of Systems: Positive for: Shortness of breath Negative for: Vision change, hearing change, syncope, dizziness, nausea, vomiting,diarrhea, bloody stool, stomach pain, cough, congestion, diaphoresis, urinary frequency, urinary pain,skin lesions, skin rashes Others previously listed  Objective: Telemetry: Normal sinus rhythm Physical Exam: Blood pressure (!) 162/96, pulse 81, temperature 98.2 F (36.8 C), temperature source Oral, resp. rate (!) 21, height 5\' 7"  (1.702 m), weight 86.2 kg (190 lb), SpO2 98 %. Body mass index is 29.76 kg/m. General: Well developed, well nourished, in no acute distress. Head: Normocephalic, atraumatic, sclera non-icteric, no xanthomas, nares are without discharge. Neck: No apparent masses Lungs: Normal respirations with no wheezes, no rhonchi, no rales , no crackles   Heart: Regular rate and rhythm, normal S1 S2, no murmur, no rub, no gallop, PMI is normal size and placement, carotid upstroke normal without bruit, jugular venous pressure normal Abdomen: Soft, non-tender, non-distended with normoactive bowel sounds. No hepatosplenomegaly. Abdominal aorta is normal size without bruit Extremities: No edema, no clubbing, no cyanosis, no ulcers,  Peripheral: 2+ radial, 2+ femoral, 2+ dorsal pedal pulses Neuro: Alert and oriented. Moves all extremities spontaneously. Psych:  Responds to questions appropriately with a normal  affect.   Intake/Output Summary (Last 24 hours) at 04/04/2017 1724 Last data filed at 04/04/2017 0940 Gross per 24 hour  Intake 2573.92 ml  Output 2400 ml  Net 173.92 ml    Inpatient Medications:  . [MAR Hold] amLODipine  5 mg Oral Daily  . [MAR Hold] aspirin EC  81 mg Oral Daily  . [MAR Hold] buPROPion  300 mg Oral Daily  . [MAR Hold] clopidogrel  75 mg Oral Daily  . [MAR Hold] docusate sodium  100 mg Oral BID  . [MAR Hold] losartan  100 mg Oral Daily  . [MAR Hold] metoprolol succinate  50 mg Oral Daily  . [MAR Hold] mometasone-formoterol  2 puff Inhalation BID  . [MAR Hold] nitroGLYCERIN  1 inch Topical Q6H  . [MAR Hold] pantoprazole  40 mg Oral Daily  . [MAR Hold] rosuvastatin  20 mg Oral Daily  . sodium chloride flush  3 mL Intravenous Q12H  . [MAR Hold] tamsulosin  0.4 mg Oral Daily   Infusions:  . sodium chloride 125 mL/hr at 04/04/17 0959  . sodium chloride    . sodium chloride    . bivalirudin (ANGIOMAX) infusion 5 mg/mL 1.75 mg/kg/hr (04/04/17 1718)  . heparin 1,150 Units/hr (04/04/17 0959)    Labs: Recent Labs    04/03/17 0334  NA 141  K 3.5  CL 111  CO2 23  GLUCOSE 130*  BUN 24*  CREATININE 1.05  CALCIUM 9.1   Recent Labs    04/03/17 0334  AST 33  ALT 25  ALKPHOS 77  BILITOT 0.8  PROT 7.0  ALBUMIN 3.9   Recent Labs    04/03/17 0334 04/04/17 0824  WBC 6.6 10.5  NEUTROABS 4.8  --   HGB 12.9* 13.3  HCT 37.7* 38.2*  MCV 85.2 85.9  PLT 225 229   Recent Labs  04/03/17 0334 04/03/17 0531 04/03/17 1255 04/03/17 1503  TROPONINI 0.74* 0.82* 3.11* 4.58*   Invalid input(s): POCBNP No results for input(s): HGBA1C in the last 72 hours.   Weights: Filed Weights   04/03/17 0335 04/04/17 1511  Weight: 86.2 kg (190 lb) 86.2 kg (190 lb)     Radiology/Studies:  Dg Chest 2 View  Result Date: 04/03/2017 CLINICAL DATA:  Recurrent chest pain radiating to LEFT arm. Shortness of breath. History of COPD, hypertension. EXAM: CHEST  2 VIEW  COMPARISON:  Chest radiograph March 28, 2017 and CT chest March 24, 2017 FINDINGS: Cardiac silhouette is mildly enlarged and unchanged. Mediastinal silhouette is not suspicious. Known aneurysm better characterized on recent CT. Mild chronic interstitial changes and increased lung volumes without pleural effusion. Faint posterior costophrenic angle airspace opacity. No pneumothorax. Soft tissue planes and included osseous structures are unchanged. IMPRESSION: Posterior confluent fibrosis versus pneumonia in a background of COPD. Mild cardiomegaly. Electronically Signed   By: Awilda Metroourtnay  Bloomer M.D.   On: 04/03/2017 04:45   Nm Myocar Multi W/spect W/wall Motion / Ef  Result Date: 03/25/2017  Blood pressure demonstrated a normal response to exercise.  There was no ST segment deviation noted during stress.  Defect 1: There is a medium defect of mild severity present in the mid inferior, apical inferior and apex location.  Findings consistent with prior myocardial infarction.  This is a low risk study.  The left ventricular ejection fraction is moderately decreased (30-44%).    Dg Chest Port 1 View  Result Date: 03/28/2017 CLINICAL DATA:  Left arm pain extending up to left shoulder, across back to right side of neck since about 9 AM today HTN, smoker- .5 pack/day EXAM: PORTABLE CHEST 1 VIEW COMPARISON:  10/06/2016 FINDINGS: Normal mediastinum and cardiac silhouette. Normal pulmonary vasculature. No evidence of effusion, infiltrate, or pneumothorax. No acute bony abnormality. IMPRESSION: No acute cardiopulmonary process. Electronically Signed   By: Genevive BiStewart  Edmunds M.D.   On: 03/28/2017 12:02   Ct Angio Chest Aorta W And/or Wo Contrast  Result Date: 03/24/2017 CLINICAL DATA:  72 year old male with a history of chest pain and hypertension. History of abdominal aortic aneurysm EXAM: CT ANGIOGRAPHY CHEST, ABDOMEN AND PELVIS TECHNIQUE: Multidetector CT imaging through the chest, abdomen and pelvis was  performed using the standard protocol during bolus administration of intravenous contrast. Multiplanar reconstructed images and MIPs were obtained and reviewed to evaluate the vascular anatomy. CONTRAST:  100mL ISOVUE-370 IOPAMIDOL (ISOVUE-370) INJECTION 76% COMPARISON:  08/02/2012, 09/12/2006, 06/06/2006 FINDINGS: CTA CHEST FINDINGS Cardiovascular: Heart: Cardiac diameter borderline enlarged. No pericardial fluid/ thickening. Coronary calcifications of the left anterior descending coronary artery. Minimal calcifications of the aortic valve. Aorta: Diameter of ascending thoracic aorta measures 4.4 cm. No dissection flap. Mild atherosclerotic changes of the aortic arch. Branch vessels are patent with 3 vessel aortic arch. Greatest diameter of the descending thoracic aorta above the hiatus measures 4.3 cm at site of aortic ulcer, projecting toward the anterior right. The ulcer measures 1.4 cm at the base, with an estimated depth of 1.1 cm. Overall, there has not been significant change from the comparison CT of 08/02/2012. No associated inflammatory changes within the fat. No periaortic fluid. 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 No inflammatory changes at this site. No periaortic fluid. The diameter of the aorta at the aortic hiatus appears relatively unchanged to the CT 08/02/2012, however, no parasagittal images are available for  comparison on the prior study. Pulmonary arteries: No central, lobar, segmental, or proximal subsegmental filling defects. Mediastinum/Nodes: No mediastinal adenopathy. Small lymph nodes are present. Unremarkable thoracic inlet. Unremarkable appearance of the thoracic esophagus. Lungs/Pleura: Paraseptal and centrilobular emphysema. No confluent airspace disease. No pleural effusion. No pneumothorax. No endotracheal or endobronchial debris. No bronchial wall thickening. 4 mm nodule  at the left lung apex adjacent to the fissure. No pneumothorax. Musculoskeletal: No acute displaced fracture. Degenerative changes of the thoracic spine. Review of the MIP images confirms the above findings. CTA ABDOMEN AND PELVIS FINDINGS VASCULAR Aorta: Below the aortic hiatus, were the diameter measures greatest, the diameter of the aorta is relatively unchanged from the comparison CT. No dissection flap. No periaortic fluid. Similar appearance of irregular plaque/ early ulceration projecting anterior and to the left at the level of the inferior mesenteric artery origin. Appearance is unchanged from the comparison. Diameter at the celiac artery measures 3.4 cm. Diameter at the superior mesenteric artery measures 3.0 cm. Diameter at the renal artery origins measures 2.4 cm. No inflammatory changes or periaortic fluid. Diameter of the distal aorta at the bifurcation measures 2.3 cm. Celiac: Minimal atherosclerotic changes at the origin of the celiac artery. SMA: Minimal atherosclerotic changes at the origin of the SMA. Renals: The bilateral main renal arteries are patent with no significant atherosclerotic changes. Small accessory left renal artery just anterior to the main left renal artery, as well as an additional accessory renal artery cephalad to the main renal artery. IMA: Inferior mesenteric arteries patent. 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 No dissection. Hypogastric artery originates from the aneurysm and remains patent. External iliac artery patent with no significant stenosis or occlusion. Proximal SFA and profunda femoris are patent. Left lower extremity: No dissection or aneurysm of the left common iliac artery. There appears to be stenosis of the hypogastric artery origin secondary to calcified and soft plaque. Mild atherosclerotic changes of the left external iliac artery. Common femoral artery, proximal  SFA and profunda femoris patent. Veins: Unremarkable appearance of the venous system. Review of the MIP images confirms the above findings. NON-VASCULAR Hepatobiliary: Hypodense lesion within the right liver, with only a single early arterial phase, in the region of the previously identified hemangioma. Lesion appears smaller on today's study measuring only approximately 2.5 cm. Unremarkable gallbladder. Pancreas: Unremarkable pancreas Spleen: Unremarkable spleen Adrenals/Urinary Tract: Unremarkable adrenal glands. Right: Nonobstructive stone at the superior collecting system of the right kidney measures 19 mm. No hydronephrosis. No perinephric stranding. Unremarkable course of the right ureter. Low-density lesion on the lateral cortex on image 110 compatible with a simple cyst. Small lesions at the posterior cortex are too small to characterize. Low-density lesion at the inferior cortex on image 123, compatible with simple cyst. Left: Nonobstructive stone at the anterior collecting system of the left kidney Unremarkable course of the left ureter. No perinephric stranding. No left-sided hydronephrosis. Low-density cyst at the superior cortex on image 92, simple cyst. Additional smaller low-density lesions on the anterior and lateral cortex are too small to characterize. Unremarkable appearance of the urinary bladder . Stomach/Bowel: Small hiatal hernia. Unremarkable stomach. Unremarkable small bowel with no abnormal distention. No focal wall thickening. No transition point or inflammatory changes. Normal appendix. Mild stool burden. No abnormal distention. No inflammatory changes. Mild colonic diverticular change. Lymphatic: No abdominal lymphadenopathy.  No inflammatory changes. Reproductive: Transverse diameter of the prostate measures 5.6 cm Other:  Bilateral fat containing inguinal hernia. Fat containing umbilical hernia. Musculoskeletal: No acute fracture. Degenerative changes of the lumbar spine. Mild  degenerative changes of the hips. IMPRESSION: No acute vascular abnormality. 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 Diameter of the aorta at the SMA origin measures 3.0 cm. Irregular ulcerated plaque of the mid descending thoracic aorta, unchanged from the comparison CT. Diameter of the aorta at this level measures approximately 4.2 cm, with depth of the ulcer approximately 1.1 cm. No associated inflammatory changes. Additional irregular/ulcerated plaque of the infrarenal abdominal aorta at the level of the IMA. Aortic Atherosclerosis (ICD10-I70.0). Paraseptal and centrilobular emphysema.  Emphysema (ICD10-J43.9). Small nodule at the left lung apex. Given the apparent risk factors, noncontrast chest CT can be considered in 12 months. This recommendation follows the consensus statement: Guidelines for Management of Incidental Pulmonary Nodules Detected on CT Images: From the Fleischner Society 2017; Radiology 2017; 284:228-243. Bilateral nonobstructing nephrolithiasis. Bilateral benign cysts of the kidneys. There are multiple bilateral kidney lesions which are too small to characterize. Small hiatal hernia. Re- demonstration of right liver lobe lesion, which is been previously characterized as hemangioma. Diverticular disease without evidence of acute diverticulitis. Single vessel coronary artery disease. Electronically Signed   By: Gilmer Mor D.O.   On: 03/24/2017 12:26   Ct Angio Abd/pel W And/or Wo Contrast  Result Date: 03/24/2017 CLINICAL DATA:  72 year old male with a history of chest pain and hypertension. History of abdominal aortic aneurysm EXAM: CT ANGIOGRAPHY CHEST, ABDOMEN AND PELVIS TECHNIQUE: Multidetector CT imaging through the chest, abdomen and pelvis was performed using the standard protocol during bolus administration of  intravenous contrast. Multiplanar reconstructed images and MIPs were obtained and reviewed to evaluate the vascular anatomy. CONTRAST:  ISOVUE-370 IOPAMIDOL (ISOVUE-370) INJECTION 76% COMPARISON:  08/02/2012, 09/12/2006, 06/06/2006 FINDINGS: CTA CHEST FINDINGS Cardiovascular: Heart: Cardiac diameter borderline enlarged. No pericardial fluid/ thickening. Coronary calcifications of the left anterior descending coronary artery. Minimal calcifications of the aortic valve. Aorta: Diameter of ascending thoracic aorta measures 4.4 cm. No dissection flap. Mild atherosclerotic changes of the aortic arch. Branch vessels are patent with 3 vessel aortic arch. Greatest diameter of the descending thoracic aorta above the hiatus measures 4.3 cm at site of aortic ulcer, projecting toward the anterior right. The ulcer measures 1.4 cm at the base, with an estimated depth of 1.1 cm. Overall, there has not been significant change from the comparison CT of 08/02/2012. No associated inflammatory changes within the fat. No periaortic fluid. 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 No inflammatory changes at this site. No periaortic fluid. The diameter of the aorta at the aortic hiatus appears relatively unchanged to the CT 08/02/2012, however, no parasagittal images are available for comparison on the prior study. Pulmonary arteries: No central, lobar, segmental, or proximal subsegmental filling defects. Mediastinum/Nodes: No mediastinal adenopathy. Small lymph nodes are present. Unremarkable thoracic inlet. Unremarkable appearance of the thoracic esophagus. Lungs/Pleura: Paraseptal and centrilobular emphysema. No confluent airspace disease. No pleural effusion. No pneumothorax. No endotracheal or endobronchial debris. No bronchial wall thickening. 4 mm nodule at the left lung apex adjacent to the fissure. No pneumothorax.  Musculoskeletal: No acute displaced fracture. Degenerative changes of the thoracic spine. Review of the MIP images confirms the above findings. CTA ABDOMEN AND PELVIS  FINDINGS VASCULAR Aorta: Below the aortic hiatus, were the diameter measures greatest, the diameter of the aorta is relatively unchanged from the comparison CT. No dissection flap. No periaortic fluid. Similar appearance of irregular plaque/ early ulceration projecting anterior and to the left at the level of the inferior mesenteric artery origin. Appearance is unchanged from the comparison. Diameter at the celiac artery measures 3.4 cm. Diameter at the superior mesenteric artery measures 3.0 cm. Diameter at the renal artery origins measures 2.4 cm. No inflammatory changes or periaortic fluid. Diameter of the distal aorta at the bifurcation measures 2.3 cm. Celiac: Minimal atherosclerotic changes at the origin of the celiac artery. SMA: Minimal atherosclerotic changes at the origin of the SMA. Renals: The bilateral main renal arteries are patent with no significant atherosclerotic changes. Small accessory left renal artery just anterior to the main left renal artery, as well as an additional accessory renal artery cephalad to the main renal artery. IMA: Inferior mesenteric arteries patent. 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 No dissection. Hypogastric artery originates from the aneurysm and remains patent. External iliac artery patent with no significant stenosis or occlusion. Proximal SFA and profunda femoris are patent. Left lower extremity: No dissection or aneurysm of the left common iliac artery. There appears to be stenosis of the hypogastric artery origin secondary to calcified and soft plaque. Mild atherosclerotic changes of the left external iliac artery. Common femoral artery, proximal SFA and profunda femoris patent. Veins: Unremarkable appearance  of the venous system. Review of the MIP images confirms the above findings. NON-VASCULAR Hepatobiliary: Hypodense lesion within the right liver, with only a single early arterial phase, in the region of the previously identified hemangioma. Lesion appears smaller on today's study measuring only approximately 2.5 cm. Unremarkable gallbladder. Pancreas: Unremarkable pancreas Spleen: Unremarkable spleen Adrenals/Urinary Tract: Unremarkable adrenal glands. Right: Nonobstructive stone at the superior collecting system of the right kidney measures 19 mm. No hydronephrosis. No perinephric stranding. Unremarkable course of the right ureter. Low-density lesion on the lateral cortex on image 110 compatible with a simple cyst. Small lesions at the posterior cortex are too small to characterize. Low-density lesion at the inferior cortex on image 123, compatible with simple cyst. Left: Nonobstructive stone at the anterior collecting system of the left kidney Unremarkable course of the left ureter. No perinephric stranding. No left-sided hydronephrosis. Low-density cyst at the superior cortex on image 92, simple cyst. Additional smaller low-density lesions on the anterior and lateral cortex are too small to characterize. Unremarkable appearance of the urinary bladder . Stomach/Bowel: Small hiatal hernia. Unremarkable stomach. Unremarkable small bowel with no abnormal distention. No focal wall thickening. No transition point or inflammatory changes. Normal appendix. Mild stool burden. No abnormal distention. No inflammatory changes. Mild colonic diverticular change. Lymphatic: No abdominal lymphadenopathy.  No inflammatory changes. Reproductive: Transverse diameter of the prostate measures 5.6 cm Other: Bilateral fat containing inguinal hernia. Fat containing umbilical hernia. Musculoskeletal: No acute fracture. Degenerative changes of the lumbar spine. Mild degenerative changes of the hips. IMPRESSION: No acute vascular  abnormality. 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 Diameter of the aorta at the SMA origin measures 3.0 cm. Irregular ulcerated plaque of the mid descending thoracic aorta, unchanged from the comparison CT. Diameter of the aorta at this level measures approximately  4.2 cm, with depth of the ulcer approximately 1.1 cm. No associated inflammatory changes. Additional irregular/ulcerated plaque of the infrarenal abdominal aorta at the level of the IMA. Aortic Atherosclerosis (ICD10-I70.0). Paraseptal and centrilobular emphysema.  Emphysema (ICD10-J43.9). Small nodule at the left lung apex. Given the apparent risk factors, noncontrast chest CT can be considered in 12 months. This recommendation follows the consensus statement: Guidelines for Management of Incidental Pulmonary Nodules Detected on CT Images: From the Fleischner Society 2017; Radiology 2017; 284:228-243. Bilateral nonobstructing nephrolithiasis. Bilateral benign cysts of the kidneys. There are multiple bilateral kidney lesions which are too small to characterize. Small hiatal hernia. Re- demonstration of right liver lobe lesion, which is been previously characterized as hemangioma. Diverticular disease without evidence of acute diverticulitis. Single vessel coronary artery disease. Electronically Signed   By: Gilmer Mor D.O.   On: 03/24/2017 12:26     Assessment and Recommendation  72 y.o. male with known cardiovascular disease essential hypertension mixed hyperlipidemia with acute non-ST elevation myocardial infarction Catheterization showing normal LV systolic function and occlusion of right coronary artery with good distal artery collateralization and significant mid LAD stenosis 1.  Proceed to PCI and stent placement of mid LAD stenosis 2.  Continue medication management  including isosorbide for collateral flow to distal right coronary artery 3.  Dual antiplatelet therapy for myocardial infarction and PCI and stent placement 4.  Beta-blocker and ACE inhibitor for hypertension control and treatment of myocardial infarction 5.  High intensity cholesterol therapy 6.  Begin rehabilitation and begin ambulation and possible discharged home tomorrow if ambulating well  Signed, Arnoldo Hooker M.D. FACC

## 2017-04-04 NOTE — Progress Notes (Signed)
ANTICOAGULATION CONSULT NOTE   Pharmacy Consult for heparin Indication: chest pain/ACS  Allergies  Allergen Reactions  . Sulfa Antibiotics     swelling    Patient Measurements: Height: 5\' 7"  (170.2 cm) Weight: 190 lb (86.2 kg) IBW/kg (Calculated) : 66.1 Heparin Dosing Weight: 83.7 kg  Vital Signs: Temp: 98.8 F (37.1 C) (01/14 0842) Temp Source: Oral (01/14 0842) BP: 146/67 (01/14 0842) Pulse Rate: 82 (01/14 0842)  Labs: Recent Labs    04/03/17 0334 04/03/17 0531 04/03/17 1255 04/03/17 1503 04/03/17 2124 04/04/17 0824  HGB 12.9*  --   --   --   --  13.3  HCT 37.7*  --   --   --   --  38.2*  PLT 225  --   --   --   --  229  APTT  --   --   --  61*  --   --   LABPROT 13.1  --   --  14.5  --   --   INR 1.00  --   --  1.14  --   --   HEPARINUNFRC  --   --   --   --  0.39 0.27*  CREATININE 1.05  --   --   --   --   --   TROPONINI 0.74* 0.82* 3.11* 4.58*  --   --     Estimated Creatinine Clearance: 69.6 mL/min (by C-G formula based on SCr of 1.05 mg/dL).   Medications:  Patient was not on any anticoagulants at home.   Assessment: 72 yo male with known CAD found to have an NSTEMI and elevated troponin. Pharmacy has been consulted to dose and monitor heparin drip.   Goal of Therapy:  Heparin level 0.3-0.7 units/ml Monitor platelets by anticoagulation protocol: Yes   Plan:  Patient was on therapeutic enoxaparin 1mg /kg Q12H  that was last given 1/13 at 0600. Therefore did not order bolus.  Start heparin infusion at 1000 units/hr Check anti-Xa level in 6 hours and daily while on heparin Continue to monitor H&H and platelets  01/13 2130 heparin level 0.39. Continue current regimen. Recheck heparin level and CBC with tomorrow AM labs.  01/14 0824 HL 0.27 subtherapeutic (of note labs delayed as pt refused labs first time). Per RN, confirms heparin drip running at 10 ml/hr, no line issues noted. Will bolus heparin 1200 units IV x1 then increase drip rate to 1150  units/hr (=11.5 ml/hr). Recheck HL 6h after rate change and CBC in AM. Hgb and plt count stable from yesterday. Plans for cardiac cath noted; will need to follow up heparin plans after cath.   Pharmacy will continue to follow.   Crist FatHannah Clint Biello, PharmD, BCPS Clinical Pharmacist 04/04/2017 9:09 AM

## 2017-04-04 NOTE — Progress Notes (Signed)
Report given to specials, patient taken down for cardiac cath.

## 2017-04-04 NOTE — Progress Notes (Signed)
Pt is refusing bed alarm. Pt sat up at 21:15 to eat and felt his groin dressing leak. Pt has small amount of bloody leakage on leg and dressing. At this time there is no active leaking coming from the dressing. Pt educated on risks and benefits. Pt has no c/o pain, no concerns offered at this time.

## 2017-04-04 NOTE — Progress Notes (Signed)
Patient clinically stable post cardiac cath per Dr Gwen PoundsKowalski with DES placed to LAD per Dr Juliann Paresallwood, pt tolerating well, vitals stable on angiomax gtt , right groin without bleeding nor hematoma. Dr Juliann Paresallwood out to speak with pt with questions answered, information with phone number given to Dr Gwen PoundsKowalski to call pt's daughter with report of heart cath.

## 2017-04-04 NOTE — Progress Notes (Signed)
[facility] [place] [facility]   PATIENT NAME: Glenn Hill    MR#:  [id number]  DATE OF BIRTH:  09/12/1945  SUBJECTIVE:   Patient here due to chest pain with elevated troponin. Trop went as high 4.5 yesterday.  Pt. Found out some disturbing news yesterday that his wife was found dead at his home by Police.  Going for Cardiac Cath later today.  No CP presently.   REVIEW OF SYSTEMS:    Review of Systems  Constitutional: Negative for chills and fever.  HENT: Negative for congestion and tinnitus.   Eyes: Negative for blurred vision and double vision.  Respiratory: Negative for cough, shortness of breath and wheezing.   Cardiovascular: Negative for chest pain, orthopnea and PND.  Gastrointestinal: Negative for abdominal pain, diarrhea, nausea and vomiting.  Genitourinary: Negative for dysuria and hematuria.  Neurological: Negative for dizziness, sensory change and focal weakness.  All other systems reviewed and are negative.   Nutrition: Heart Healthy/Carb control Tolerating Diet: Yes Tolerating PT: Await Eval.   DRUG ALLERGIES:   Allergies  Allergen Reactions  . Sulfa Antibiotics     swelling    VITALS:  Blood pressure (!) 162/96, pulse 81, temperature 98.2 F (36.8 C), temperature source Oral, resp. rate (!) 21, height 5\' 7"  (1.702 m), weight 86.2 kg (190 lb), SpO2 97 %.  PHYSICAL EXAMINATION:   Physical Exam  GENERAL:  72 y.o.-year-old patient lying in bed in no acute distress.  EYES: Pupils equal, round, reactive to light and accommodation. No scleral icterus. Extraocular muscles intact.  HEENT: Head atraumatic, normocephalic. Oropharynx and nasopharynx clear.  NECK:  Supple, no jugular venous distention. No thyroid enlargement, no tenderness.  LUNGS: Normal breath sounds bilaterally, no wheezing, rales, rhonchi. No use of accessory muscles of respiration.  CARDIOVASCULAR: S1, S2 normal. II/VI SEM at LSB, No rubs, or gallops.  ABDOMEN: Soft,  nontender, nondistended. Bowel sounds present. No organomegaly or mass.  EXTREMITIES: No cyanosis, clubbing or edema b/l.    NEUROLOGIC: Cranial nerves II through XII are intact. No focal Motor or sensory deficits b/l.   PSYCHIATRIC: The patient is alert and oriented x 3.  SKIN: No obvious rash, lesion, or ulcer.    LABORATORY PANEL:   CBC Recent Labs  Lab 04/04/17 0824  WBC 10.5  HGB 13.3  HCT 38.2*  PLT 229   ------------------------------------------------------------------------------------------------------------------  Chemistries  Recent Labs  Lab 04/03/17 0334  NA 141  K 3.5  CL 111  CO2 23  GLUCOSE 130*  BUN 24*  CREATININE 1.05  CALCIUM 9.1  AST 33  ALT 25  ALKPHOS 77  BILITOT 0.8   ------------------------------------------------------------------------------------------------------------------  Cardiac Enzymes Recent Labs  Lab 04/03/17 1503  TROPONINI 4.58*   ------------------------------------------------------------------------------------------------------------------  RADIOLOGY:  Dg Chest 2 View  Result Date: 04/03/2017 CLINICAL DATA:  Recurrent chest pain radiating to LEFT arm. Shortness of breath. History of COPD, hypertension. EXAM: CHEST  2 VIEW COMPARISON:  Chest radiograph March 28, 2017 and CT chest March 24, 2017 FINDINGS: Cardiac silhouette is mildly enlarged and unchanged. Mediastinal silhouette is not suspicious. Known aneurysm better characterized on recent CT. Mild chronic interstitial changes and increased lung volumes without pleural effusion. Faint posterior costophrenic angle airspace opacity. No pneumothorax. Soft tissue planes and included osseous structures are unchanged. IMPRESSION: Posterior confluent fibrosis versus pneumonia in a background of COPD. Mild cardiomegaly. Electronically Signed   By: Awilda Metroourtnay  Bloomer M.D.   On: 04/03/2017 04:45     ASSESSMENT AND PLAN:  72 year old male with past medical history of  abdominal aortic aneurysm, hypertension, hyperlipidemia, COPD, BPH, history of nephrolithiasis who presents to the hospital due to chest pains and noted to have an elevated troponin.  1. Chest pain with elevated troponin-patient was here about 10 days ago with similar complaints underwent a nuclear medicine stress test which was abnormal and Cardiology recommended cardiac catheterization but the patient refused as he had to go take care of his wife.  -Patient is now readmitted with similar complaints and has elevated troponins consistent with a non-ST elevation MI. Seen by cardiology and plan for cardiac catheterization today. -Continue aspirin, Plavix, metoprolol, losartan, Crestor, Lovenox.  2. Essential hypertension-continue Toprol, losartan, Norvasc.  3. Hyperlipidemia-continue Crestor.  4. BPH-continue Flomax.  5. COPD-no acute exacerbation. Continue albuterol nebulizers, Dulera.  6. Anxiety - cont. PRN Xanax.  Pt. Found out some disturbing news about his wife as she was found dead by Police yesterday.    All the records are reviewed and case discussed with Care Management/Social Worker. Management plans discussed with the patient, family and they are in agreement.  CODE STATUS: Full code  DVT Prophylaxis: Lovenox  TOTAL TIME TAKING CARE OF THIS PATIENT: 30 minutes.   POSSIBLE D/C IN 1-2 DAYS, DEPENDING ON CLINICAL CONDITION.   Houston Siren M.D on 04/04/2017 at 3:23 PM  Between 7am to 6pm - Pager - [phone]  After 6pm go to [email or web address] - Social research officer, government  [employer] [facility]  Office  [phone]  CC: Primary care physician; McLean-Scocuzza, Pasty Spillers, MD

## 2017-04-04 NOTE — Progress Notes (Signed)
Report called to pt's care nurse Weldon PickingAlejo Calderon RN with plan reviewed, questions answered.

## 2017-04-04 NOTE — Plan of Care (Signed)
  Progressing Education: Knowledge of General Education information will improve 04/04/2017 0308 - Progressing by Dorna LeitzNesbitt, Ariea Rochin M, RN Pain Managment: General experience of comfort will improve 04/04/2017 0308 - Progressing by Dorna LeitzNesbitt, Estevan Kersh M, RN Education: Understanding of cardiac disease, CV risk reduction, and recovery process will improve 04/04/2017 0308 - Progressing by Dorna LeitzNesbitt, Kanasia Gayman M, RN Understanding of medication regimen will improve 04/04/2017 0308 - Progressing by Dorna LeitzNesbitt, Jimesha Rising M, RN

## 2017-04-04 NOTE — Progress Notes (Signed)
angiomax gtt has finished at this time,no bleeding nor hematoma at right groin site. Patient denies complaints.

## 2017-04-05 ENCOUNTER — Telehealth: Payer: Self-pay

## 2017-04-05 LAB — CBC
HCT: 37 % — ABNORMAL LOW (ref 40.0–52.0)
Hemoglobin: 12.6 g/dL — ABNORMAL LOW (ref 13.0–18.0)
MCH: 29.4 pg (ref 26.0–34.0)
MCHC: 34.1 g/dL (ref 32.0–36.0)
MCV: 86.4 fL (ref 80.0–100.0)
PLATELETS: 210 10*3/uL (ref 150–440)
RBC: 4.28 MIL/uL — AB (ref 4.40–5.90)
RDW: 15.8 % — AB (ref 11.5–14.5)
WBC: 10.6 10*3/uL (ref 3.8–10.6)

## 2017-04-05 MED ORDER — TICAGRELOR 90 MG PO TABS: 90.0000 mg | ORAL_TABLET | Freq: Two times a day (BID) | ORAL | 1 refills | Status: AC

## 2017-04-05 MED ORDER — ALPRAZOLAM 0.25 MG PO TABS: 0.2500 mg | ORAL_TABLET | Freq: Three times a day (TID) | ORAL | 0 refills | Status: AC | PRN

## 2017-04-05 NOTE — Telephone Encounter (Signed)
Ok that's fine  [employer]

## 2017-04-05 NOTE — Care Management Important Message (Signed)
Important Message  Patient Details  Name: Glenn Hill MRN: [id number] Date of Birth: 10/06/1945   Medicare Important Message Given:  N/A - LOS <3 / Initial given by admissions    Eber HongGreene, Deiondra Denley R, RN 04/05/2017, 9:37 AM

## 2017-04-05 NOTE — Care Management (Signed)
Patient to discharge on Brilinta.  Patient verbally confirmed pharmacy coverage with insurance.  Provided with coupon 

## 2017-04-05 NOTE — Telephone Encounter (Signed)
Copied from CRM [phone]. Topic: General - Other >> Apr 05, 2017 10:21 AM Percival SpanishKennedy, Cheryl W wrote:  [facility] call to set up pt Hosp fup with in a week and Dr Mclean-Scocuzza has nothing available. Can we check with the doctor and see where we can fit him in [phone]

## 2017-04-05 NOTE — Telephone Encounter (Signed)
Only appointment available same day 04/08/17 11:45am  this week . Please advise

## 2017-04-05 NOTE — Progress Notes (Signed)
[facility] Encounter Note  Patient: Glenn Hill / Admit Date: 04/03/2017 / Date of Encounter: 04/05/2017, 5:28 AM   Subjective: Patient with no further chest discomfort.  Peak troponin 4.5 consistent with non-ST elevation myocardial infarction.  EKG unchanged patient tolerating medications well Cardiac cath showing normal LV systolic function with ejection fraction of 50% Occluded small right coronary artery in the mid section with good collaterals from left anterior descending artery and septal arteries Significant mid LAD stenosis of 80% Review of Systems: Positive for: None Negative for: Vision change, hearing change, syncope, dizziness, nausea, vomiting,diarrhea, bloody stool, stomach pain, cough, congestion, diaphoresis, urinary frequency, urinary pain,skin lesions, skin rashes Others previously listed  Objective: Telemetry: Normal sinus rhythm Physical Exam: Blood pressure 128/66, pulse 81, temperature 99.4 F (37.4 C), temperature source Oral, resp. rate 20, height 5\' 7"  (1.702 m), weight 86.2 kg (190 lb), SpO2 96 %. Body mass index is 29.76 kg/m. General: Well developed, well nourished, in no acute distress. Head: Normocephalic, atraumatic, sclera non-icteric, no xanthomas, nares are without discharge. Neck: No apparent masses Lungs: Normal respirations with no wheezes, no rhonchi, no rales , no crackles   Heart: Regular rate and rhythm, normal S1 S2, no murmur, no rub, no gallop, PMI is normal size and placement, carotid upstroke normal without bruit, jugular venous pressure normal Abdomen: Soft, non-tender, non-distended with normoactive bowel sounds. No hepatosplenomegaly. Abdominal aorta is normal size without bruit Extremities: No edema, no clubbing, no cyanosis, no ulcers,  Peripheral: 2+ radial, 2+ femoral, 2+ dorsal pedal pulses Neuro: Alert and oriented. Moves all extremities spontaneously. Psych:  Responds to questions appropriately with a  normal affect.   Intake/Output Summary (Last 24 hours) at 04/05/2017 0528 Last data filed at 04/05/2017 0448 Gross per 24 hour  Intake 1959.75 ml  Output 800 ml  Net 1159.75 ml    Inpatient Medications:  . amLODipine  5 mg Oral Daily  . aspirin  81 mg Oral Daily  . aspirin EC  81 mg Oral Daily  . buPROPion  300 mg Oral Daily  . clopidogrel  75 mg Oral Daily  . docusate sodium  100 mg Oral BID  . losartan  100 mg Oral Daily  . metoprolol succinate  50 mg Oral Daily  . mometasone-formoterol  2 puff Inhalation BID  . nitroGLYCERIN  1 inch Topical Q6H  . pantoprazole  40 mg Oral Daily  . rosuvastatin  20 mg Oral Daily  . tamsulosin  0.4 mg Oral Daily  . ticagrelor  90 mg Oral BID   Infusions:  . sodium chloride 125 mL/hr at 04/05/17 0448    Labs: Recent Labs    04/03/17 0334  NA 141  K 3.5  CL 111  CO2 23  GLUCOSE 130*  BUN 24*  CREATININE 1.05  CALCIUM 9.1   Recent Labs    04/03/17 0334  AST 33  ALT 25  ALKPHOS 77  BILITOT 0.8  PROT 7.0  ALBUMIN 3.9   Recent Labs    04/03/17 0334 04/04/17 0824  WBC 6.6 10.5  NEUTROABS 4.8  --   HGB 12.9* 13.3  HCT 37.7* 38.2*  MCV 85.2 85.9  PLT 225 229   Recent Labs    04/03/17 0334 04/03/17 0531 04/03/17 1255 04/03/17 1503  TROPONINI 0.74* 0.82* 3.11* 4.58*   Invalid input(s): POCBNP No results for input(s): HGBA1C in the last 72 hours.   Weights: Filed Weights   04/03/17 0335 04/04/17 1511  Weight: 86.2 kg (190  lb) 86.2 kg (190 lb)     Radiology/Studies:  Dg Chest 2 View  Result Date: 04/03/2017 CLINICAL DATA:  Recurrent chest pain radiating to LEFT arm. Shortness of breath. History of COPD, hypertension. EXAM: CHEST  2 VIEW COMPARISON:  Chest radiograph March 28, 2017 and CT chest March 24, 2017 FINDINGS: Cardiac silhouette is mildly enlarged and unchanged. Mediastinal silhouette is not suspicious. Known aneurysm better characterized on recent CT. Mild chronic interstitial changes and increased  lung volumes without pleural effusion. Faint posterior costophrenic angle airspace opacity. No pneumothorax. Soft tissue planes and included osseous structures are unchanged. IMPRESSION: Posterior confluent fibrosis versus pneumonia in a background of COPD. Mild cardiomegaly. Electronically Signed   By: Awilda Metro M.D.   On: 04/03/2017 04:45   Nm Myocar Multi W/spect W/wall Motion / Ef  Result Date: 03/25/2017  Blood pressure demonstrated a normal response to exercise.  There was no ST segment deviation noted during stress.  Defect 1: There is a medium defect of mild severity present in the mid inferior, apical inferior and apex location.  Findings consistent with prior myocardial infarction.  This is a low risk study.  The left ventricular ejection fraction is moderately decreased (30-44%).    Dg Chest Port 1 View  Result Date: 03/28/2017 CLINICAL DATA:  Left arm pain extending up to left shoulder, across back to right side of neck since about 9 AM today HTN, smoker- .5 pack/day EXAM: PORTABLE CHEST 1 VIEW COMPARISON:  10/06/2016 FINDINGS: Normal mediastinum and cardiac silhouette. Normal pulmonary vasculature. No evidence of effusion, infiltrate, or pneumothorax. No acute bony abnormality. IMPRESSION: No acute cardiopulmonary process. Electronically Signed   By: Genevive Bi M.D.   On: 03/28/2017 12:02   Ct Angio Chest Aorta W And/or Wo Contrast  Result Date: 03/24/2017 CLINICAL DATA:  72 year old male with a history of chest pain and hypertension. History of abdominal aortic aneurysm EXAM: CT ANGIOGRAPHY CHEST, ABDOMEN AND PELVIS TECHNIQUE: Multidetector CT imaging through the chest, abdomen and pelvis was performed using the standard protocol during bolus administration of intravenous contrast. Multiplanar reconstructed images and MIPs were obtained and reviewed to evaluate the vascular anatomy. CONTRAST:  ISOVUE-370 IOPAMIDOL (ISOVUE-370) INJECTION 76% COMPARISON:  08/02/2012,  09/12/2006, 06/06/2006 FINDINGS: CTA CHEST FINDINGS Cardiovascular: Heart: Cardiac diameter borderline enlarged. No pericardial fluid/ thickening. Coronary calcifications of the left anterior descending coronary artery. Minimal calcifications of the aortic valve. Aorta: Diameter of ascending thoracic aorta measures 4.4 cm. No dissection flap. Mild atherosclerotic changes of the aortic arch. Branch vessels are patent with 3 vessel aortic arch. Greatest diameter of the descending thoracic aorta above the hiatus measures 4.3 cm at site of aortic ulcer, projecting toward the anterior right. The ulcer measures 1.4 cm at the base, with an estimated depth of 1.1 cm. Overall, there has not been significant change from the comparison CT of 08/02/2012. No associated inflammatory changes within the fat. No periaortic fluid. 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 No inflammatory changes at this site. No periaortic fluid. The diameter of the aorta at the aortic hiatus appears relatively unchanged to the CT 08/02/2012, however, no parasagittal images are available for comparison on the prior study. Pulmonary arteries: No central, lobar, segmental, or proximal subsegmental filling defects. Mediastinum/Nodes: No mediastinal adenopathy. Small lymph nodes are present. Unremarkable thoracic inlet. Unremarkable appearance of the thoracic esophagus. Lungs/Pleura: Paraseptal and centrilobular emphysema. No confluent airspace disease. No pleural  effusion. No pneumothorax. No endotracheal or endobronchial debris. No bronchial wall thickening. 4 mm nodule at the left lung apex adjacent to the fissure. No pneumothorax. Musculoskeletal: No acute displaced fracture. Degenerative changes of the thoracic spine. Review of the MIP images confirms the above findings. CTA ABDOMEN AND PELVIS FINDINGS VASCULAR Aorta: Below the aortic hiatus,  were the diameter measures greatest, the diameter of the aorta is relatively unchanged from the comparison CT. No dissection flap. No periaortic fluid. Similar appearance of irregular plaque/ early ulceration projecting anterior and to the left at the level of the inferior mesenteric artery origin. Appearance is unchanged from the comparison. Diameter at the celiac artery measures 3.4 cm. Diameter at the superior mesenteric artery measures 3.0 cm. Diameter at the renal artery origins measures 2.4 cm. No inflammatory changes or periaortic fluid. Diameter of the distal aorta at the bifurcation measures 2.3 cm. Celiac: Minimal atherosclerotic changes at the origin of the celiac artery. SMA: Minimal atherosclerotic changes at the origin of the SMA. Renals: The bilateral main renal arteries are patent with no significant atherosclerotic changes. Small accessory left renal artery just anterior to the main left renal artery, as well as an additional accessory renal artery cephalad to the main renal artery. IMA: Inferior mesenteric arteries patent. 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 No dissection. Hypogastric artery originates from the aneurysm and remains patent. External iliac artery patent with no significant stenosis or occlusion. Proximal SFA and profunda femoris are patent. Left lower extremity: No dissection or aneurysm of the left common iliac artery. There appears to be stenosis of the hypogastric artery origin secondary to calcified and soft plaque. Mild atherosclerotic changes of the left external iliac artery. Common femoral artery, proximal SFA and profunda femoris patent. Veins: Unremarkable appearance of the venous system. Review of the MIP images confirms the above findings. NON-VASCULAR Hepatobiliary: Hypodense lesion within the right liver, with only a single early arterial phase, in the region of the previously  identified hemangioma. Lesion appears smaller on today's study measuring only approximately 2.5 cm. Unremarkable gallbladder. Pancreas: Unremarkable pancreas Spleen: Unremarkable spleen Adrenals/Urinary Tract: Unremarkable adrenal glands. Right: Nonobstructive stone at the superior collecting system of the right kidney measures 19 mm. No hydronephrosis. No perinephric stranding. Unremarkable course of the right ureter. Low-density lesion on the lateral cortex on image 110 compatible with a simple cyst. Small lesions at the posterior cortex are too small to characterize. Low-density lesion at the inferior cortex on image 123, compatible with simple cyst. Left: Nonobstructive stone at the anterior collecting system of the left kidney Unremarkable course of the left ureter. No perinephric stranding. No left-sided hydronephrosis. Low-density cyst at the superior cortex on image 92, simple cyst. Additional smaller low-density lesions on the anterior and lateral cortex are too small to characterize. Unremarkable appearance of the urinary bladder . Stomach/Bowel: Small hiatal hernia. Unremarkable stomach. Unremarkable small bowel with no abnormal distention. No focal wall thickening. No transition point or inflammatory changes. Normal appendix. Mild stool burden. No abnormal distention. No inflammatory changes. Mild colonic diverticular change. Lymphatic: No abdominal lymphadenopathy.  No inflammatory changes. Reproductive: Transverse diameter of the prostate measures 5.6 cm Other: Bilateral fat containing inguinal hernia. Fat containing umbilical hernia. Musculoskeletal: No acute fracture. Degenerative changes of the lumbar spine. Mild degenerative changes of the hips. IMPRESSION: No acute vascular abnormality. 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  Diameter of the aorta at the SMA origin measures 3.0 cm. Irregular ulcerated plaque of the mid descending thoracic aorta, unchanged from the comparison CT. Diameter of the aorta at this level measures approximately 4.2 cm, with depth of the ulcer approximately 1.1 cm. No associated inflammatory changes. Additional irregular/ulcerated plaque of the infrarenal abdominal aorta at the level of the IMA. Aortic Atherosclerosis (ICD10-I70.0). Paraseptal and centrilobular emphysema.  Emphysema (ICD10-J43.9). Small nodule at the left lung apex. Given the apparent risk factors, noncontrast chest CT can be considered in 12 months. This recommendation follows the consensus statement: Guidelines for Management of Incidental Pulmonary Nodules Detected on CT Images: From the Fleischner Society 2017; Radiology 2017; 284:228-243. Bilateral nonobstructing nephrolithiasis. Bilateral benign cysts of the kidneys. There are multiple bilateral kidney lesions which are too small to characterize. Small hiatal hernia. Re- demonstration of right liver lobe lesion, which is been previously characterized as hemangioma. Diverticular disease without evidence of acute diverticulitis. Single vessel coronary artery disease. Electronically Signed   By: Gilmer Mor D.O.   On: 03/24/2017 12:26   Ct Angio Abd/pel W And/or Wo Contrast  Result Date: 03/24/2017 CLINICAL DATA:  72 year old male with a history of chest pain and hypertension. History of abdominal aortic aneurysm EXAM: CT ANGIOGRAPHY CHEST, ABDOMEN AND PELVIS TECHNIQUE: Multidetector CT imaging through the chest, abdomen and pelvis was performed using the standard protocol during bolus administration of intravenous contrast. Multiplanar reconstructed images and MIPs were obtained and reviewed to evaluate the vascular anatomy. CONTRAST:  ISOVUE-370 IOPAMIDOL (ISOVUE-370) INJECTION 76% COMPARISON:  08/02/2012, 09/12/2006, 06/06/2006 FINDINGS: CTA CHEST FINDINGS Cardiovascular: Heart:  Cardiac diameter borderline enlarged. No pericardial fluid/ thickening. Coronary calcifications of the left anterior descending coronary artery. Minimal calcifications of the aortic valve. Aorta: Diameter of ascending thoracic aorta measures 4.4 cm. No dissection flap. Mild atherosclerotic changes of the aortic arch. Branch vessels are patent with 3 vessel aortic arch. Greatest diameter of the descending thoracic aorta above the hiatus measures 4.3 cm at site of aortic ulcer, projecting toward the anterior right. The ulcer measures 1.4 cm at the base, with an estimated depth of 1.1 cm. Overall, there has not been significant change from the comparison CT of 08/02/2012. No associated inflammatory changes within the fat. No periaortic fluid. 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 No inflammatory changes at this site. No periaortic fluid. The diameter of the aorta at the aortic hiatus appears relatively unchanged to the CT 08/02/2012, however, no parasagittal images are available for comparison on the prior study. Pulmonary arteries: No central, lobar, segmental, or proximal subsegmental filling defects. Mediastinum/Nodes: No mediastinal adenopathy. Small lymph nodes are present. Unremarkable thoracic inlet. Unremarkable appearance of the thoracic esophagus. Lungs/Pleura: Paraseptal and centrilobular emphysema. No confluent airspace disease. No pleural effusion. No pneumothorax. No endotracheal or endobronchial debris. No bronchial wall thickening. 4 mm nodule at the left lung apex adjacent to the fissure. No pneumothorax. Musculoskeletal: No acute displaced fracture. Degenerative changes of the thoracic spine. Review of the MIP images confirms the above findings. CTA ABDOMEN AND PELVIS FINDINGS VASCULAR Aorta: Below the aortic hiatus, were the diameter measures greatest, the diameter of the aorta is relatively  unchanged from the comparison CT. No dissection flap. No periaortic fluid. Similar appearance of irregular plaque/ early ulceration projecting anterior and to the left at the  level of the inferior mesenteric artery origin. Appearance is unchanged from the comparison. Diameter at the celiac artery measures 3.4 cm. Diameter at the superior mesenteric artery measures 3.0 cm. Diameter at the renal artery origins measures 2.4 cm. No inflammatory changes or periaortic fluid. Diameter of the distal aorta at the bifurcation measures 2.3 cm. Celiac: Minimal atherosclerotic changes at the origin of the celiac artery. SMA: Minimal atherosclerotic changes at the origin of the SMA. Renals: The bilateral main renal arteries are patent with no significant atherosclerotic changes. Small accessory left renal artery just anterior to the main left renal artery, as well as an additional accessory renal artery cephalad to the main renal artery. IMA: Inferior mesenteric arteries patent. 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 No dissection. Hypogastric artery originates from the aneurysm and remains patent. External iliac artery patent with no significant stenosis or occlusion. Proximal SFA and profunda femoris are patent. Left lower extremity: No dissection or aneurysm of the left common iliac artery. There appears to be stenosis of the hypogastric artery origin secondary to calcified and soft plaque. Mild atherosclerotic changes of the left external iliac artery. Common femoral artery, proximal SFA and profunda femoris patent. Veins: Unremarkable appearance of the venous system. Review of the MIP images confirms the above findings. NON-VASCULAR Hepatobiliary: Hypodense lesion within the right liver, with only a single early arterial phase, in the region of the previously identified hemangioma. Lesion appears smaller on today's study measuring  only approximately 2.5 cm. Unremarkable gallbladder. Pancreas: Unremarkable pancreas Spleen: Unremarkable spleen Adrenals/Urinary Tract: Unremarkable adrenal glands. Right: Nonobstructive stone at the superior collecting system of the right kidney measures 19 mm. No hydronephrosis. No perinephric stranding. Unremarkable course of the right ureter. Low-density lesion on the lateral cortex on image 110 compatible with a simple cyst. Small lesions at the posterior cortex are too small to characterize. Low-density lesion at the inferior cortex on image 123, compatible with simple cyst. Left: Nonobstructive stone at the anterior collecting system of the left kidney Unremarkable course of the left ureter. No perinephric stranding. No left-sided hydronephrosis. Low-density cyst at the superior cortex on image 92, simple cyst. Additional smaller low-density lesions on the anterior and lateral cortex are too small to characterize. Unremarkable appearance of the urinary bladder . Stomach/Bowel: Small hiatal hernia. Unremarkable stomach. Unremarkable small bowel with no abnormal distention. No focal wall thickening. No transition point or inflammatory changes. Normal appendix. Mild stool burden. No abnormal distention. No inflammatory changes. Mild colonic diverticular change. Lymphatic: No abdominal lymphadenopathy.  No inflammatory changes. Reproductive: Transverse diameter of the prostate measures 5.6 cm Other: Bilateral fat containing inguinal hernia. Fat containing umbilical hernia. Musculoskeletal: No acute fracture. Degenerative changes of the lumbar spine. Mild degenerative changes of the hips. IMPRESSION: No acute vascular abnormality. 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 Diameter of the aorta at the SMA origin measures 3.0 cm. Irregular ulcerated  plaque of the mid descending thoracic aorta, unchanged from the comparison CT. Diameter of the aorta at this level measures approximately 4.2 cm, with depth of the ulcer approximately 1.1 cm. No associated inflammatory changes. Additional irregular/ulcerated plaque of the infrarenal abdominal aorta at the level of the IMA. Aortic Atherosclerosis (ICD10-I70.0). Paraseptal and centrilobular emphysema.  Emphysema (ICD10-J43.9). Small nodule at the left lung apex.  Given the apparent risk factors, noncontrast chest CT can be considered in 12 months. This recommendation follows the consensus statement: Guidelines for Management of Incidental Pulmonary Nodules Detected on CT Images: From the Fleischner Society 2017; Radiology 2017; 284:228-243. Bilateral nonobstructing nephrolithiasis. Bilateral benign cysts of the kidneys. There are multiple bilateral kidney lesions which are too small to characterize. Small hiatal hernia. Re- demonstration of right liver lobe lesion, which is been previously characterized as hemangioma. Diverticular disease without evidence of acute diverticulitis. Single vessel coronary artery disease. Electronically Signed   By: Gilmer Mor D.O.   On: 03/24/2017 12:26     Assessment and Recommendation  72 y.o. male with known cardiovascular disease essential hypertension mixed hyperlipidemia with acute non-ST elevation myocardial infarction Catheterization showing normal LV systolic function and occlusion of right coronary artery with good distal artery collateralization and significant mid LAD stenosis 1.  Successful PCI and stent placement of mid LAD stenosis 2.  Continue medication management including isosorbide for collateral flow to distal right coronary artery without need for further intervention 3.  Dual antiplatelet therapy for myocardial infarction and PCI and stent placement 4.  Beta-blocker and ACE inhibitor for hypertension control and treatment of myocardial infarction 5.   High intensity cholesterol therapy 6.  Begin rehabilitation and begin ambulation and possible discharged home today if ambulating well with follow-up in 1-2 weeks  Signed, Arnoldo Hooker M.D. FACC

## 2017-04-05 NOTE — Telephone Encounter (Signed)
Placed on schedule for 04/08/17 at 11:45 per Dr Shirlee LatchMcLean.   Left voice mail to call back ok for [facility] to speak to patient

## 2017-04-05 NOTE — Progress Notes (Addendum)
Patient referred to Cardiac Rehab with dx of NSTEMI by Dr. Juliann Paresallwood. 72 year old male with known cardiovascular disease, HTN, HLD, who presented to hospital with acute NSTEMI.  Cardiac Cath performed which revealed normal LV systolic function and occlusion of RCA with good distal artery collaterals and significant mid LAD stenosis.  Successful PCI and stent placement to mid LAD stenosis.  Plan per Dr. Gwen PoundsKowalski is to continue medical management including Isosorbide for collateral flow to distal RCA without need for further intervention.    "Heart Attack Bouncing Back" booklet given and reviewed with patient and daughter. Discussed the definition of CAD. Reviewed the location CAD and where stents were placed. Patient informed he will be given a stent card. Explained the purpose of the stent card. Instructed patient to keep stent card in her wallet.  ? Discussed modifiable risk factors including controlling blood pressure, cholesterol, and blood sugar; following heart healthy diet; maintaining healthy weight; exercise; and smoking cessation, if applicable. ? ? Discussed cardiac medications including rationale for taking meds, mechanisms of action, and side effects. Stressed the importance of taking medications as prescribed.  ? Discussed emergency plan for heart attack symptoms. Patient verbalized understanding of need to call 911 and not to drive herself to ER if having cardiac symptoms / chest pain.  ? Heart healthy diet of low sodium, low fat, low cholesterol heart healthy diet discussed. Information on diet provided.  ? Exercise - Patient does not currently exercise. Benefits of exercise discussed. Explained to patient and daughter patient had been referred to Cardiac Rehab. Overview of the program provided. Patient interested in the program. Both patient and daughter asked questions. Patient in interested in the program and stated, "I need to do something."  Patient needs to check with his insurance  first to see what his out-of-pocket expenses would be if he participates in the program.  NOTE:  Patient unable to  schedule an orientation at this time, as his wife passed away on Sunday, April 03, 2017.  Patient is anxious to get home and take care of funeral arrangements for his wife.   ? Smoking Cessation - When patient was admitted he was an every day smoker.  Now patient is stating he has just quit. Information given on smoking cessation and tips on quitting.    ? Patient and daughter thanked me for providing the above information.  ? ? Army Meliaiane Wright, RN, BSN, [facility] Nurse Navigator ?

## 2017-04-05 NOTE — Progress Notes (Addendum)
Glenn Hill to be D/C'd Home per MD order.  Pt ambulated around nurses station and tolerated well, no complaints of cp. Discussed prescriptions and follow up appointments with the patient. Prescriptions given to patient, medication list explained in detail. Pt verbalized understanding.  Allergies as of 04/05/2017      Reactions   Sulfa Antibiotics    swelling      Medication List    TAKE these medications   albuterol 108 (90 Base) MCG/ACT inhaler Commonly known as:  PROVENTIL HFA;VENTOLIN HFA Inhale 1-2 puffs into the lungs every 6 (six) hours as needed for wheezing or shortness of breath (cough).   ALPRAZolam 0.25 MG tablet Commonly known as:  XANAX Take 1 tablet (0.25 mg total) by mouth 3 (three) times daily as needed for anxiety.   amLODipine 5 MG tablet Commonly known as:  NORVASC Take 1 tablet (5 mg total) by mouth daily.   aspirin EC 81 MG tablet Take 81 mg by mouth daily.   budesonide-formoterol 160-4.5 MCG/ACT inhaler Commonly known as:  SYMBICORT Inhale 2 puffs into the lungs 2 (two) times daily. Rinse mouth   buPROPion 300 MG 24 hr tablet Commonly known as:  WELLBUTRIN XL Take 1 tablet (300 mg total) by mouth daily. In am   losartan 100 MG tablet Commonly known as:  COZAAR Take 1 tablet (100 mg total) by mouth daily.   metoprolol succinate 50 MG 24 hr tablet Commonly known as:  TOPROL-XL Take 1 tablet (50 mg total) by mouth daily. Take with or immediately following a meal.   omeprazole 20 MG capsule Commonly known as:  PRILOSEC Take 1 capsule (20 mg total) by mouth daily. In am 30 minutss before breakfast   rosuvastatin 20 MG tablet Commonly known as:  CRESTOR Take 1 tablet (20 mg total) by mouth daily.   tamsulosin 0.4 MG Caps capsule Commonly known as:  FLOMAX Take 0.4 mg by mouth daily.   ticagrelor 90 MG Tabs tablet Commonly known as:  BRILINTA Take 1 tablet (90 mg total) by mouth 2 (two) times daily.       Vitals:   04/05/17 0352  04/05/17 0800  BP: 128/66 133/68  Pulse: 81 65  Resp: 20 20  Temp: 99.4 F (37.4 C)   SpO2: 96% 97%    Tele box removed and returned.Skin clean, dry and intact without evidence of skin break down, no evidence of skin tears noted. IV catheter discontinued intact. Site without signs and symptoms of complications. Dressing and pressure applied. Pt denies pain at this time. No complaints noted.  An After Visit Summary was printed and given to the patient. Patient escorted via WC, and D/C home via private auto.  Glenn Hill

## 2017-04-05 NOTE — Discharge Summary (Signed)
[facility]   PATIENT NAME: Glenn CongChristopher Hill    MR#:  [id number]  DATE OF BIRTH:  12/22/1945  DATE OF ADMISSION:  04/03/2017 ADMITTING PHYSICIAN: Arnaldo NatalMichael S Diamond, MD  DATE OF DISCHARGE: 04/05/2017 11:00 AM  PRIMARY CARE PHYSICIAN: McLean-Scocuzza, Pasty Spillersracy N, MD    ADMISSION DIAGNOSIS:  Chest pain, unspecified type [R07.9]  DISCHARGE DIAGNOSIS:  Active Problems:   NSTEMI (non-ST elevated myocardial infarction) (HCC)   SECONDARY DIAGNOSIS:   Past Medical History:  Diagnosis Date  . AAA (abdominal aortic aneurysm) (HCC)   . BPH (benign prostatic hyperplasia)   . COPD (chronic obstructive pulmonary disease) (HCC)   . Elevated PSA   . HLD (hyperlipidemia)   . HTN (hypertension)   . Kidney stones     HOSPITAL COURSE:   72 year old male with past medical history of abdominal aortic aneurysm, hypertension, hyperlipidemia, COPD, BPH, history of nephrolithiasis who presents to the hospital due to chest pains and noted to have an elevated troponin.  1. Chest pain with elevated troponin-patient was here about 10 days ago with similar complaints underwent a nuclear medicine stress test which was abnormal and Cardiology recommended cardiac catheterization but the patient refused as he had to go take care of his wife.  -Patient was readmitted with similar complaints and has elevated troponins consistent with a non-ST elevation MI. -Patient is status post cardiac catheterization with successful PCI and stent placement to the mid LAD stenosis. Patient is currently chest pain-free and hemodynamically stable. He is now being discharged on aspirin, Brillinta, metoprolol, losartan, Crestor.  2. Essential hypertension- pt. Will continue Toprol, losartan, Norvasc.  3. Hyperlipidemia- pt. Will continue Crestor.  4. BPH- pt. Will continue Flomax.  5. COPD-no acute exacerbation. - pt. Will cont. Albuterol, Symbicort upon discharge.   6. Anxiety -  due to Pt. Found out some disturbing news about his wife as she was found dead by Police on 04/03/17.  - pt. Was discharged on some Xanax as needed.     DISCHARGE CONDITIONS:   Stable.   CONSULTS OBTAINED:    DRUG ALLERGIES:   Allergies  Allergen Reactions  . Sulfa Antibiotics     swelling    DISCHARGE MEDICATIONS:   Allergies as of 04/05/2017      Reactions   Sulfa Antibiotics    swelling      Medication List    TAKE these medications   albuterol 108 (90 Base) MCG/ACT inhaler Commonly known as:  PROVENTIL HFA;VENTOLIN HFA Inhale 1-2 puffs into the lungs every 6 (six) hours as needed for wheezing or shortness of breath (cough).   ALPRAZolam 0.25 MG tablet Commonly known as:  XANAX Take 1 tablet (0.25 mg total) by mouth 3 (three) times daily as needed for anxiety.   amLODipine 5 MG tablet Commonly known as:  NORVASC Take 1 tablet (5 mg total) by mouth daily.   aspirin EC 81 MG tablet Take 81 mg by mouth daily.   budesonide-formoterol 160-4.5 MCG/ACT inhaler Commonly known as:  SYMBICORT Inhale 2 puffs into the lungs 2 (two) times daily. Rinse mouth   buPROPion 300 MG 24 hr tablet Commonly known as:  WELLBUTRIN XL Take 1 tablet (300 mg total) by mouth daily. In am   losartan 100 MG tablet Commonly known as:  COZAAR Take 1 tablet (100 mg total) by mouth daily.   metoprolol succinate 50 MG 24 hr tablet Commonly known as:  TOPROL-XL Take 1 tablet (50 mg total) by mouth daily.  Take with or immediately following a meal.   omeprazole 20 MG capsule Commonly known as:  PRILOSEC Take 1 capsule (20 mg total) by mouth daily. In am 30 minutss before breakfast   rosuvastatin 20 MG tablet Commonly known as:  CRESTOR Take 1 tablet (20 mg total) by mouth daily.   tamsulosin 0.4 MG Caps capsule Commonly known as:  FLOMAX Take 0.4 mg by mouth daily.   ticagrelor 90 MG Tabs tablet Commonly known as:  BRILINTA Take 1 tablet (90 mg total) by mouth 2 (two) times  daily.         DISCHARGE INSTRUCTIONS:   DIET:  Cardiac diet  DISCHARGE CONDITION:  Stable  ACTIVITY:  Activity as tolerated  OXYGEN:  Home Oxygen: No.   Oxygen Delivery: room air  DISCHARGE LOCATION:  home   If you experience worsening of your admission symptoms, develop shortness of breath, life threatening emergency, suicidal or homicidal thoughts you must seek medical attention immediately by calling 911 or calling your MD immediately  if symptoms less severe.  You Must read complete instructions/literature along with all the possible adverse reactions/side effects for all the Medicines you take and that have been prescribed to you. Take any new Medicines after you have completely understood and accpet all the possible adverse reactions/side effects.   Please note  You were cared for by a hospitalist during your hospital stay. If you have any questions about your discharge medications or the care you received while you were in the hospital after you are discharged, you can call the unit and asked to speak with the hospitalist on call if the hospitalist that took care of you is not available. Once you are discharged, your primary care physician will handle any further medical issues. Please note that NO REFILLS for any discharge medications will be authorized once you are discharged, as it is imperative that you return to your primary care physician (or establish a relationship with a primary care physician if you do not have one) for your aftercare needs so that they can reassess your need for medications and monitor your lab values.     Today   Pt. Denies any chest pain and feels better now.  Will d/c home today.   VITAL SIGNS:  Blood pressure 133/68, pulse 65, temperature 99.4 F (37.4 C), temperature source Oral, resp. rate 20, height 5\' 7"  (1.702 m), weight 83.9 kg (185 lb), SpO2 97 %.  I/O:    Intake/Output Summary (Last 24 hours) at 04/05/2017 1522 Last data  filed at 04/05/2017 1021 Gross per 24 hour  Intake 1455 ml  Output 900 ml  Net 555 ml    PHYSICAL EXAMINATION:   GENERAL:  72 y.o.-year-old patient lying in bed in no acute distress.  EYES: Pupils equal, round, reactive to light and accommodation. No scleral icterus. Extraocular muscles intact.  HEENT: Head atraumatic, normocephalic. Oropharynx and nasopharynx clear.  NECK:  Supple, no jugular venous distention. No thyroid enlargement, no tenderness.  LUNGS: Normal breath sounds bilaterally, no wheezing, rales, rhonchi. No use of accessory muscles of respiration.  CARDIOVASCULAR: S1, S2 normal. II/VI SEM at LSB, No rubs, or gallops.  ABDOMEN: Soft, nontender, nondistended. Bowel sounds present. No organomegaly or mass.  EXTREMITIES: No cyanosis, clubbing or edema b/l.    NEUROLOGIC: Cranial nerves II through XII are intact. No focal Motor or sensory deficits b/l.   PSYCHIATRIC: The patient is alert and oriented x 3.  SKIN: No obvious rash, lesion, or ulcer.  DATA REVIEW:   CBC Recent Labs  Lab 04/05/17 0504  WBC 10.6  HGB 12.6*  HCT 37.0*  PLT 210    Chemistries  Recent Labs  Lab 04/03/17 0334  NA 141  K 3.5  CL 111  CO2 23  GLUCOSE 130*  BUN 24*  CREATININE 1.05  CALCIUM 9.1  AST 33  ALT 25  ALKPHOS 77  BILITOT 0.8    Cardiac Enzymes Recent Labs  Lab 04/03/17 1503  TROPONINI 4.58*    Microbiology Results  No results found for this or any previous visit.  RADIOLOGY:  No results found.    Management plans discussed with the patient, family and they are in agreement.  CODE STATUS:  Code Status History    Date Active Date Inactive Code Status Order ID Comments User Context   04/03/2017 05:21 04/05/2017 14:44 Full Code [id number]  Arnaldo Natal, MD Inpatient   TOTAL TIME TAKING CARE OF THIS PATIENT: 40 minutes.    Houston Siren M.D on 04/05/2017 at 3:22 PM  Between 7am to 6pm - Pager - [phone]  After 6pm go to [email or web address] -  Social research officer, government  [employer] [facility]  Office  [phone]  CC: Primary care physician; McLean-Scocuzza, Pasty Spillers, MD

## 2017-04-06 ENCOUNTER — Telehealth: Payer: Self-pay

## 2017-04-06 ENCOUNTER — Encounter: Payer: Self-pay | Admitting: Internal Medicine

## 2017-04-08 ENCOUNTER — Ambulatory Visit: Payer: Medicare HMO | Admitting: Internal Medicine

## 2017-04-18 ENCOUNTER — Telehealth: Payer: Self-pay | Admitting: Internal Medicine

## 2017-04-18 NOTE — Telephone Encounter (Signed)
Copied from CRM [phone]. Topic: General - Deceased Patient >> Apr 18, 2017 10:43 AM Cecelia ByarsGreen, Temeka L, RMA wrote: Reason for CRM: patient passed away on October 18, 2017, pt daughter called to notify office

## 2017-04-18 NOTE — Telephone Encounter (Signed)
FYI

## 2017-04-22 NOTE — Telephone Encounter (Signed)
S/p discharge with PCI x 1 patient died after hospital discharge when trying to call back today

## 2017-04-22 NOTE — Telephone Encounter (Signed)
Copied from CRM [phone]. Topic: General - Deceased Patient >> Apr 06, 2017 10:38 AM Clack, Princella PellegriniJessica D wrote: Reason for CRM: Pt daug Lanora Manislizabeth called and wanted to let Dr.Tracy know the pt passed away last night/this morning. Daughter wasn't sure just stated she found him this morning decease.  Route to department's PEC Pool.

## 2017-04-22 DEATH — deceased

## 2017-04-26 ENCOUNTER — Ambulatory Visit (INDEPENDENT_AMBULATORY_CARE_PROVIDER_SITE_OTHER): Payer: Medicare HMO | Admitting: Vascular Surgery

## 2017-04-28 ENCOUNTER — Ambulatory Visit: Payer: Medicare HMO | Admitting: Internal Medicine

## 2019-06-16 IMAGING — CR DG CHEST 2V
2 series · 2 of 2 positions shown · non-contrast
Comparison: Chest radiograph March 28, 2017 and CT chest March 24, 2017

CLINICAL DATA: Recurrent chest pain radiating to LEFT arm.
Shortness of breath. History of COPD, hypertension.

EXAM:
CHEST  2 VIEW

[chest pa]
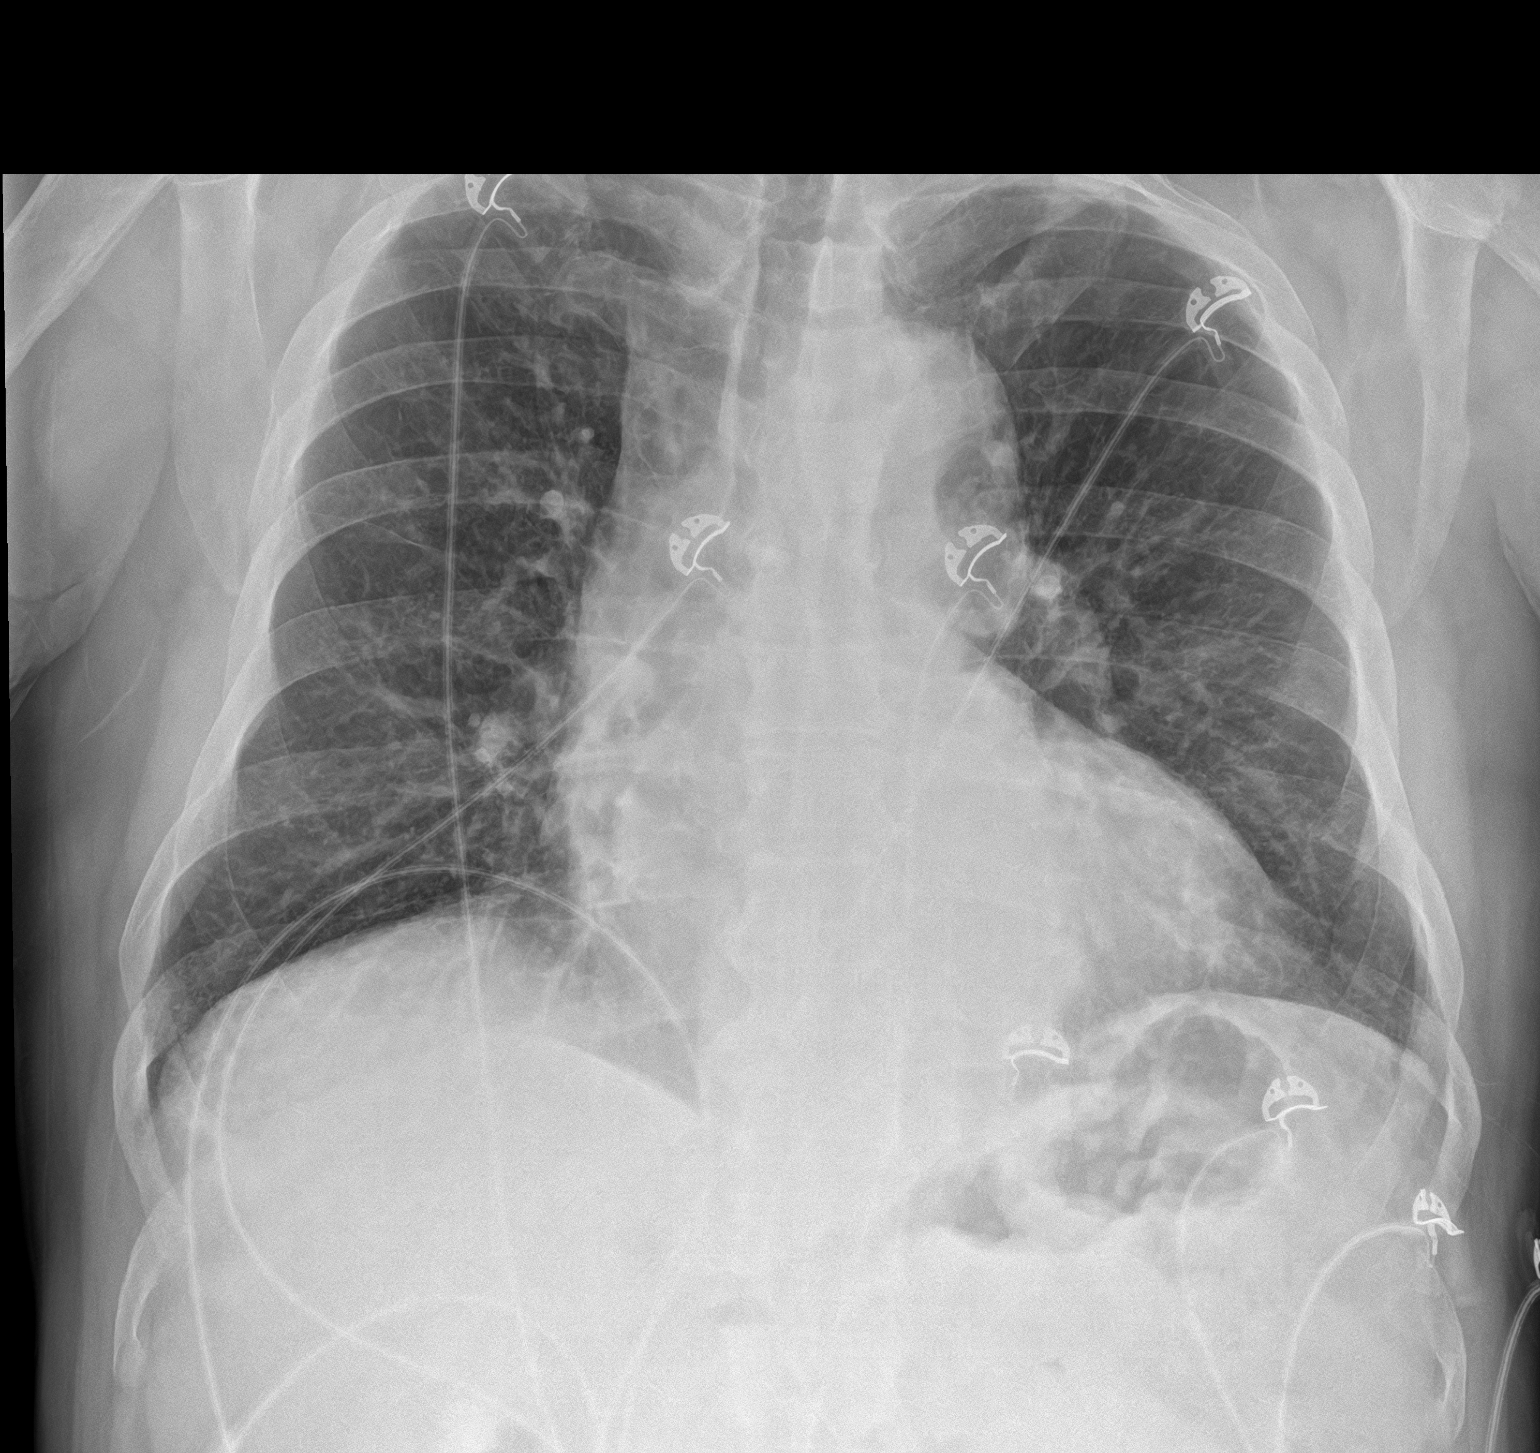

[chest lat]
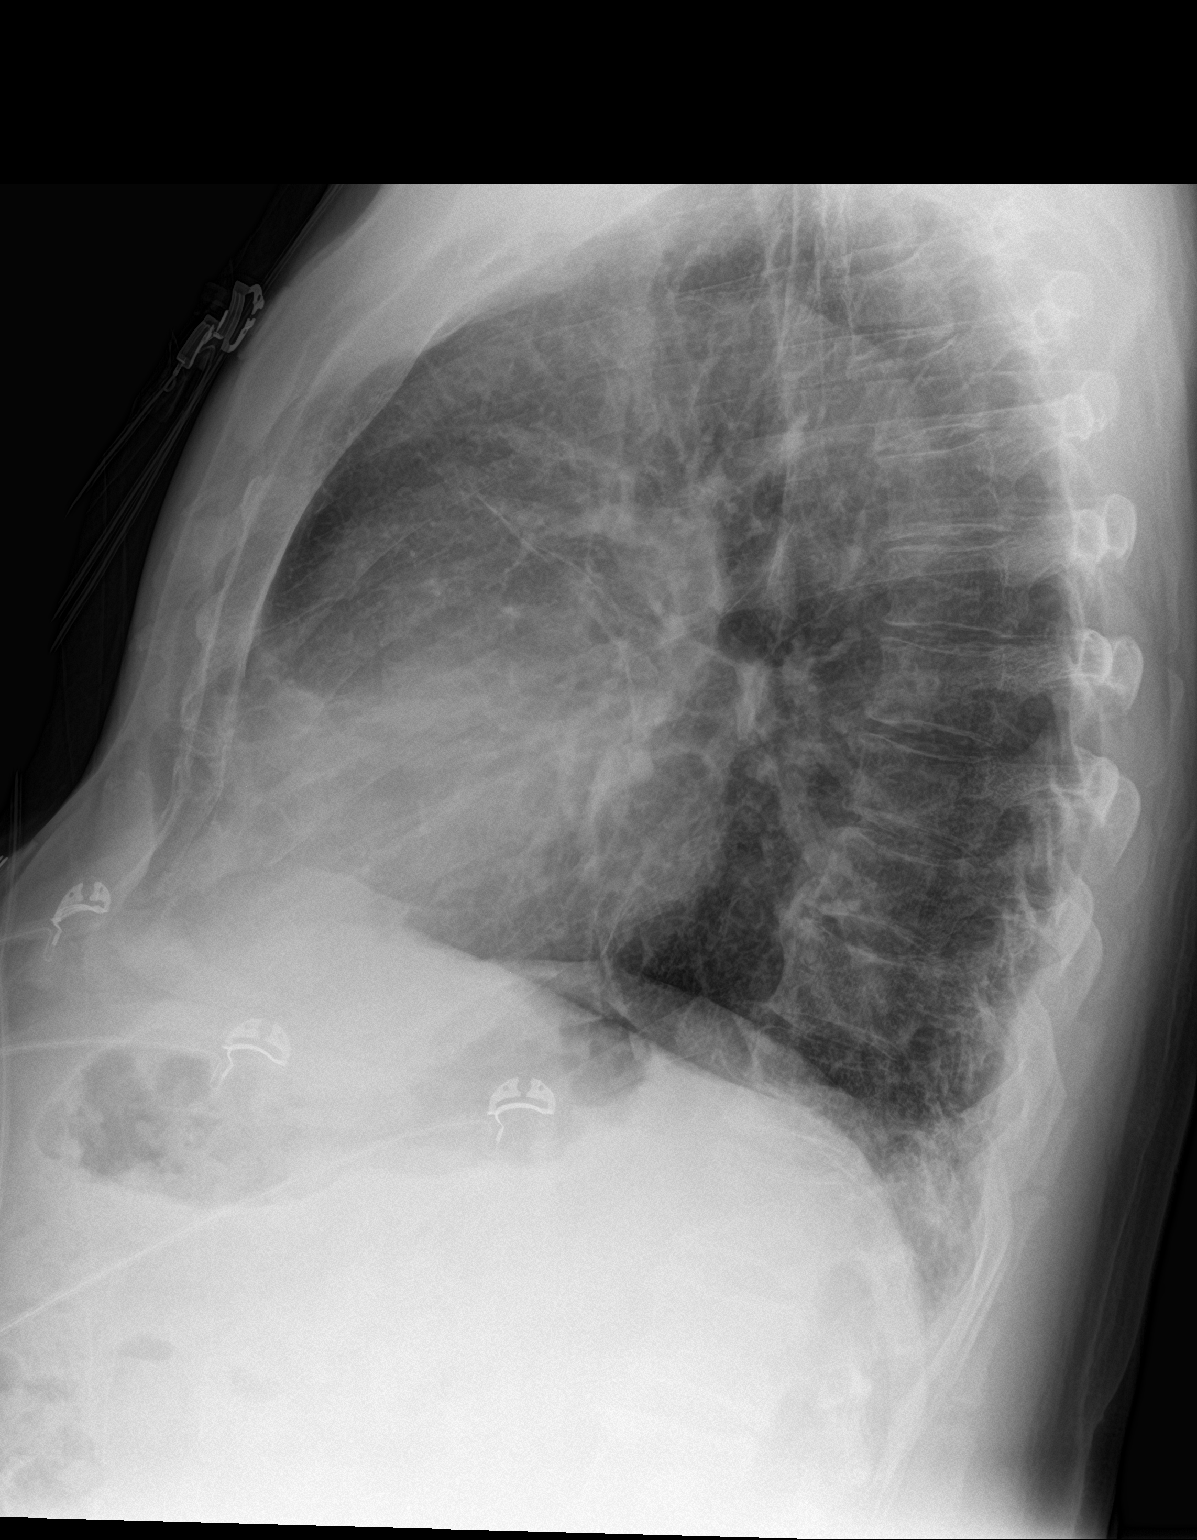

[2 of 2 positions shown; findings below may reference images not displayed]

FINDINGS: Cardiac silhouette is mildly enlarged and unchanged. Mediastinal
silhouette is not suspicious. Known aneurysm better characterized on
recent CT. Mild chronic interstitial changes and increased lung
volumes without pleural effusion. Faint posterior costophrenic angle
airspace opacity. No pneumothorax. Soft tissue planes and included
osseous structures are unchanged.
IMPRESSION: Posterior confluent fibrosis versus pneumonia in a background of
COPD.

Mild cardiomegaly.
